# Patient Record
Sex: Female | Born: 1965 | Race: White | Hispanic: No | Marital: Single | Smoking: Current every day smoker
Health system: Southern US, Community
[De-identification: ages and names within clinical notes are randomized; demographics above are authoritative.]

## PROBLEM LIST (undated history)

## (undated) DIAGNOSIS — I10 Essential (primary) hypertension: Secondary | ICD-10-CM

## (undated) DIAGNOSIS — J449 Chronic obstructive pulmonary disease, unspecified: Secondary | ICD-10-CM

## (undated) DIAGNOSIS — M797 Fibromyalgia: Secondary | ICD-10-CM

## (undated) HISTORY — PX: APPENDECTOMY: SHX54

---

## 2006-01-15 ENCOUNTER — Ambulatory Visit: Payer: Self-pay | Admitting: Gynecology

## 2007-11-18 ENCOUNTER — Emergency Department: Payer: Self-pay | Admitting: Emergency Medicine

## 2008-10-22 ENCOUNTER — Emergency Department: Payer: Self-pay | Admitting: Emergency Medicine

## 2010-02-17 ENCOUNTER — Emergency Department: Payer: Self-pay | Admitting: Emergency Medicine

## 2010-02-20 ENCOUNTER — Emergency Department: Payer: Self-pay | Admitting: Emergency Medicine

## 2010-09-07 ENCOUNTER — Emergency Department: Payer: Self-pay | Admitting: Unknown Physician Specialty

## 2010-10-21 NOTE — Group Therapy Note (Signed)
NAME:  Gabrielle Ray, Gabrielle Ray NO.:  [id number]   MEDICAL RECORD NO.:  [id number]          PATIENT TYPE:  POB   LOCATION:  [facility]                   FACILITY:  [facility]   PHYSICIAN:  Ginger Carne, MD DATE OF BIRTH:  [birth year]   DATE OF SERVICE:  01/15/2006                                    CLINIC NOTE   [place]:  This patient is a 45 year old Caucasian female who presents with a one year  history of breast discomfort, primarily on the right and occasionally on the  left.  In the past two to three months it has been constant.  In addition,  the patient notices a clear milky to dark black discharge primarily on  squeezing the nipple, but occasionally spontaneously as well.  She does not  palpate any masses on either breast.  She takes no medications other than a  multivitamin.  She denies headaches or blurry vision.   PHYSICAL EXAMINATION:  Right and left breast demonstrate no evidence of  dominant masses.  Discharge is present on the right breast on voluntary  squeezing which is milky in appearance.  No thickenings or pinpoint  tenderness noted.  Axilla, supraclavicular nodes on either side also within  normal limits.   IMPRESSION:  Galactorrhea primarily of the right breast and right breast  discomfort.   PLAN:  Patient will have a prolactin and TSH level ordered in addition to a  diagnostic mammogram.  Follow up from there regarding management.           ______________________________  Ginger Carne, MD     SHB/MEDQ  D:  01/15/2006  T:  01/16/2006  Job:  [id number]

## 2010-11-15 ENCOUNTER — Emergency Department: Payer: Self-pay | Admitting: Emergency Medicine

## 2013-08-20 ENCOUNTER — Emergency Department: Payer: Self-pay | Admitting: Emergency Medicine

## 2014-08-11 ENCOUNTER — Emergency Department: Payer: Self-pay | Admitting: Emergency Medicine

## 2015-09-10 ENCOUNTER — Emergency Department: Payer: Worker's Compensation

## 2015-09-10 ENCOUNTER — Emergency Department
Admission: EM | Admit: 2015-09-10 | Discharge: 2015-09-10 | Disposition: A | Discharge status: Home / Self Care | Payer: Worker's Compensation | Attending: Emergency Medicine | Admitting: Emergency Medicine

## 2015-09-10 ENCOUNTER — Encounter: Payer: Self-pay | Admitting: Emergency Medicine

## 2015-09-10 DIAGNOSIS — F17210 Nicotine dependence, cigarettes, uncomplicated: Secondary | ICD-10-CM | POA: Diagnosis not present

## 2015-09-10 DIAGNOSIS — Y9389 Activity, other specified: Secondary | ICD-10-CM | POA: Insufficient documentation

## 2015-09-10 DIAGNOSIS — Y929 Unspecified place or not applicable: Secondary | ICD-10-CM | POA: Insufficient documentation

## 2015-09-10 DIAGNOSIS — Y990 Civilian activity done for income or pay: Secondary | ICD-10-CM | POA: Insufficient documentation

## 2015-09-10 DIAGNOSIS — S3992XA Unspecified injury of lower back, initial encounter: Secondary | ICD-10-CM

## 2015-09-10 DIAGNOSIS — W19XXXA Unspecified fall, initial encounter: Secondary | ICD-10-CM

## 2015-09-10 DIAGNOSIS — I10 Essential (primary) hypertension: Secondary | ICD-10-CM | POA: Diagnosis not present

## 2015-09-10 DIAGNOSIS — W1831XA Fall on same level due to stepping on an object, initial encounter: Secondary | ICD-10-CM | POA: Insufficient documentation

## 2015-09-10 DIAGNOSIS — S39012A Strain of muscle, fascia and tendon of lower back, initial encounter: Secondary | ICD-10-CM

## 2015-09-10 HISTORY — DX: Essential (primary) hypertension: I10

## 2015-09-10 HISTORY — DX: Fibromyalgia: M79.7

## 2015-09-10 MED ORDER — METHOCARBAMOL 500 MG PO TABS: 500.0000 mg | ORAL_TABLET | Freq: Four times a day (QID) | ORAL | Status: DC

## 2015-09-10 MED ORDER — MELOXICAM 15 MG PO TABS: 15.0000 mg | ORAL_TABLET | Freq: Every day | ORAL | Status: DC

## 2015-09-10 NOTE — ED Notes (Signed)
Urine drug screen performed. 

## 2015-09-10 NOTE — ED Notes (Signed)
Pt in via triage w/ complaints of lower back pain x 1 month.  Pt reports incident at work approximately a month ago where she stepped down quickly to keep from falling and back has been hurting since then.  Pt reports pain has worsened over the last two days.  Pt ambulatory but with difficulty; pt reports pain/difficulty walking, sitting, standing.  Pt A/Ox4.  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

## 2015-09-10 NOTE — Discharge Instructions (Signed)
Back Exercises °The following exercises strengthen the muscles that help to support the back. They also help to keep the lower back flexible. Doing these exercises can help to prevent back pain or lessen existing pain. °If you have back pain or discomfort, try doing these exercises 2-3 times each day or as told by your health care provider. When the pain goes away, do them once each day, but increase the number of times that you repeat the steps for each exercise (do more repetitions). If you do not have back pain or discomfort, do these exercises once each day or as told by your health care provider. °EXERCISES °Single Knee to Chest °Repeat these steps 3-5 times for each leg: °· Lie on your back on a firm bed or the floor with your legs extended. °· Bring one knee to your chest. Your other leg should stay extended and in contact with the floor. °· Hold your knee in place by grabbing your knee or thigh. °· Pull on your knee until you feel a gentle stretch in your lower back. °· Hold the stretch for 10-30 seconds. °· Slowly release and straighten your leg. °Pelvic Tilt °Repeat these steps 5-10 times: °· Lie on your back on a firm bed or the floor with your legs extended. °· Bend your knees so they are pointing toward the ceiling and your feet are flat on the floor. °· Tighten your lower abdominal muscles to press your lower back against the floor. This motion will tilt your pelvis so your tailbone points up toward the ceiling instead of pointing to your feet or the floor. °· With gentle tension and even breathing, hold this position for 5-10 seconds. °Cat-Cow °Repeat these steps until your lower back becomes more flexible: °· Get into a hands-and-knees position on a firm surface. Keep your hands under your shoulders, and keep your knees under your hips. You may place padding under your knees for comfort. °· Let your head hang down, and point your tailbone toward the floor so your lower back becomes rounded like the  back of a cat. °· Hold this position for 5 seconds. °· Slowly lift your head and point your tailbone up toward the ceiling so your back forms a sagging arch like the back of a cow. °· Hold this position for 5 seconds. °Press-Ups °Repeat these steps 5-10 times: °· Lie on your abdomen (face-down) on the floor. °· Place your palms near your head, about shoulder-width apart. °· While you keep your back as relaxed as possible and keep your hips on the floor, slowly straighten your arms to raise the top half of your body and lift your shoulders. Do not use your back muscles to raise your upper torso. You may adjust the placement of your hands to make yourself more comfortable. °· Hold this position for 5 seconds while you keep your back relaxed. °· Slowly return to lying flat on the floor. °Bridges °Repeat these steps 10 times: °· Lie on your back on a firm surface. °· Bend your knees so they are pointing toward the ceiling and your feet are flat on the floor. °· Tighten your buttocks muscles and lift your buttocks off of the floor until your waist is at almost the same height as your knees. You should feel the muscles working in your buttocks and the back of your thighs. If you do not feel these muscles, slide your feet 1-2 inches farther away from your buttocks. °· Hold this position for 3-5   seconds. °· Slowly lower your hips to the starting position, and allow your buttocks muscles to relax completely. °If this exercise is too easy, try doing it with your arms crossed over your chest. °Abdominal Crunches °Repeat these steps 5-10 times: °· Lie on your back on a firm bed or the floor with your legs extended. °· Bend your knees so they are pointing toward the ceiling and your feet are flat on the floor. °· Cross your arms over your chest. °· Tip your chin slightly toward your chest without bending your neck. °· Tighten your abdominal muscles and slowly raise your trunk (torso) high enough to lift your shoulder blades a  tiny bit off of the floor. Avoid raising your torso higher than that, because it can put too much stress on your low back and it does not help to strengthen your abdominal muscles. °· Slowly return to your starting position. °Back Lifts °Repeat these steps 5-10 times: °· Lie on your abdomen (face-down) with your arms at your sides, and rest your forehead on the floor. °· Tighten the muscles in your legs and your buttocks. °· Slowly lift your chest off of the floor while you keep your hips pressed to the floor. Keep the back of your head in line with the curve in your back. Your eyes should be looking at the floor. °· Hold this position for 3-5 seconds. °· Slowly return to your starting position. °SEEK MEDICAL CARE IF: °· Your back pain or discomfort gets much worse when you do an exercise. °· Your back pain or discomfort does not lessen within 2 hours after you exercise. °If you have any of these problems, stop doing these exercises right away. Do not do them again unless your health care provider says that you can. °SEEK IMMEDIATE MEDICAL CARE IF: °· You develop sudden, severe back pain. If this happens, stop doing the exercises right away. Do not do them again unless your health care provider says that you can. °  °This information is not intended to replace advice given to you by your health care provider. Make sure you discuss any questions you have with your health care provider. °  °Document Released: 06/29/2004 Document Revised: 02/10/2015 Document Reviewed: 07/16/2014 °Elsevier Interactive Patient Education ©2016 [employer] °Sciatica °Sciatica is pain, weakness, numbness, or tingling along the path of the sciatic nerve. The nerve starts in the lower back and runs down the back of each leg. The nerve controls the muscles in the lower leg and in the back of the knee, while also providing sensation to the back of the thigh, lower leg, and the sole of your foot. Sciatica is a symptom of another medical  condition. For instance, nerve damage or certain conditions, such as a herniated disk or bone spur on the spine, pinch or put pressure on the sciatic nerve. This causes the pain, weakness, or other sensations normally associated with sciatica. Generally, sciatica only affects one side of the body. °CAUSES  °· Herniated or slipped disc. °· Degenerative disk disease. °· A pain disorder involving the narrow muscle in the buttocks (piriformis syndrome). °· Pelvic injury or fracture. °· Pregnancy. °· Tumor (rare). °SYMPTOMS  °Symptoms can vary from mild to very severe. The symptoms usually travel from the low back to the buttocks and down the back of the leg. Symptoms can include: °· Mild tingling or dull aches in the lower back, leg, or hip. °· Numbness in the back of the calf or sole of the   foot. °· Burning sensations in the lower back, leg, or hip. °· Sharp pains in the lower back, leg, or hip. °· Leg weakness. °· Severe back pain inhibiting movement. °These symptoms may get worse with coughing, sneezing, laughing, or prolonged sitting or standing. Also, being overweight may worsen symptoms. °DIAGNOSIS  °Your caregiver will perform a physical exam to look for common symptoms of sciatica. He or she may ask you to do certain movements or activities that would trigger sciatic nerve pain. Other tests may be performed to find the cause of the sciatica. These may include: °· Blood tests. °· X-rays. °· Imaging tests, such as an MRI or CT scan. °TREATMENT  °Treatment is directed at the cause of the sciatic pain. Sometimes, treatment is not necessary and the pain and discomfort goes away on its own. If treatment is needed, your caregiver may suggest: °· Over-the-counter medicines to relieve pain. °· Prescription medicines, such as anti-inflammatory medicine, muscle relaxants, or narcotics. °· Applying heat or ice to the painful area. °· Steroid injections to lessen pain, irritation, and inflammation around the  nerve. °· Reducing activity during periods of pain. °· Exercising and stretching to strengthen your abdomen and improve flexibility of your spine. Your caregiver may suggest losing weight if the extra weight makes the back pain worse. °· Physical therapy. °· Surgery to eliminate what is pressing or pinching the nerve, such as a bone spur or part of a herniated disk. °HOME CARE INSTRUCTIONS  °· Only take over-the-counter or prescription medicines for pain or discomfort as directed by your caregiver. °· Apply ice to the affected area for 20 minutes, 3-4 times a day for the first 48-72 hours. Then try heat in the same way. °· Exercise, stretch, or perform your usual activities if these do not aggravate your pain. °· Attend physical therapy sessions as directed by your caregiver. °· Keep all follow-up appointments as directed by your caregiver. °· Do not wear high heels or shoes that do not provide proper support. °· Check your mattress to see if it is too soft. A firm mattress may lessen your pain and discomfort. °SEEK IMMEDIATE MEDICAL CARE IF:  °· You lose control of your bowel or bladder (incontinence). °· You have increasing weakness in the lower back, pelvis, buttocks, or legs. °· You have redness or swelling of your back. °· You have a burning sensation when you urinate. °· You have pain that gets worse when you lie down or awakens you at night. °· Your pain is worse than you have experienced in the past. °· Your pain is lasting longer than 4 weeks. °· You are suddenly losing weight without reason. °MAKE SURE YOU: °· Understand these instructions. °· Will watch your condition. °· Will get help right away if you are not doing well or get worse. °  °This information is not intended to replace advice given to you by your health care provider. Make sure you discuss any questions you have with your health care provider. °  °Document Released: 05/16/2001 Document Revised: 02/10/2015 Document Reviewed:  10/01/2011 °Elsevier Interactive Patient Education ©2016 [employer] Inc. ° °

## 2015-09-10 NOTE — ED Notes (Signed)
Pt alert and oriented X4, active, cooperative, pt in NAD. RR even and unlabored, color WNL.  Pt informed to return if any life threatening symptoms occur.   

## 2015-09-10 NOTE — ED Notes (Signed)
Patient reports back pain a month ago but increased last night and today.  Patient slipped on a machine at work about a month ago and fell on to back and right leg.  Bilateral lower back pain, worse on the left. Patient states she has difficulty sitting, laying or standing.  Pain is worse when sitting down.  Patient had a neurology appointment.  Pt has a hx of fibromyalgia and is currently on gabapentin and zanaflex.

## 2015-09-10 NOTE — ED Provider Notes (Signed)
[facility] Emergency Department Provider Note  ____________________________________________  Time seen: Approximately 8:20 PM  I have reviewed the triage vital signs and the nursing notes.   HISTORY  Chief Complaint Back Pain    HPI Gabrielle Ray is a 50 y.o. female who presents emergency department complaining of lower back pain 1 month. Patient states that she was at work approximately a month ago when she stepped back on a piece of equipment and landed on her feet. She states that she jarred her back at that time and has had some pains since this occurrence. She states the pain is increasing is a sharp sensation. She states that occasionally it radiates down bilateral legs. She denies any bowel or bladder dysfunction, saddle anesthesia, paresthesias. Patient is not taking any medications for this complaint at this time.   Past Medical History  Diagnosis Date  . Hypertension   . Fibromyalgia     There are no active problems to display for this patient.   History reviewed. No pertinent past surgical history.  Current Outpatient Rx  Name  Route  Sig  Dispense  Refill  . meloxicam (MOBIC) 15 MG tablet   Oral   Take 1 tablet (15 mg total) by mouth daily.   30 tablet   0   . methocarbamol (ROBAXIN) 500 MG tablet   Oral   Take 1 tablet (500 mg total) by mouth 4 (four) times daily.   16 tablet   0     Allergies Review of patient's allergies indicates no known allergies.  History reviewed. No pertinent family history.  Social History Social History  Substance Use Topics  . Smoking status: Current Every Day Smoker -- 1.00 packs/day    Types: Cigarettes  . Smokeless tobacco: None  . Alcohol Use: No     Review of Systems  Constitutional: No fever/chills Cardiovascular: no chest pain. Respiratory: no cough. No SOB. Genitourinary: Negative for dysuria. No hematuria Musculoskeletal: Positive for lower back pain. Skin: Negative for  rash. Neurological: Negative for headaches, focal weakness or numbness. 10-point ROS otherwise negative.  ____________________________________________   PHYSICAL EXAM:  VITAL SIGNS: ED Triage Vitals  Enc Vitals Group     BP 09/10/15 1847 141/81 mmHg     Pulse Rate 09/10/15 1847 80     Resp 09/10/15 1847 18     Temp 09/10/15 1847 97.5 F (36.4 C)     Temp Source 09/10/15 1847 Oral     SpO2 09/10/15 1847 98 %     Weight 09/10/15 1851 220 lb (99.791 kg)     Height 09/10/15 1851 5\' 5"  (1.651 m)     Head Cir --      Peak Flow --      Pain Score 09/10/15 1851 8     Pain Loc --      Pain Edu? --      Excl. in GC? --      Constitutional: Alert and oriented. Well appearing and in no acute distress. Eyes: Conjunctivae are normal. PERRL. EOMI. Head: Atraumatic. Cardiovascular: Normal rate, regular rhythm. Normal S1 and S2.  Good peripheral circulation. Respiratory: Normal respiratory effort without tachypnea or retractions. Lungs CTAB. Gastrointestinal: Soft and nontender. No distention. No CVA tenderness. Musculoskeletal: No visible deformity displayed upon inspection. Patient is tender to palpation both midline as well as bilateral paraspinal muscle groups. No palpable abnormality. Patient is nontender to palpation over the sciatic notches. Negative straight leg raise bilaterally. Sensation intact equally lower extremities.  Dorsalis pedis pulse is intact or shortness. Neurologic:  Normal speech and language. No gross focal neurologic deficits are appreciated.  Skin:  Skin is warm, dry and intact. No rash noted. Psychiatric: Mood and affect are normal. Speech and behavior are normal. Patient exhibits appropriate insight and judgement.   ____________________________________________   LABS (all labs ordered are listed, but only abnormal results are displayed)  Labs Reviewed - No data to  display ____________________________________________  EKG   ____________________________________________  RADIOLOGY Festus Barren Vinie Charity, personally viewed and evaluated these images (plain radiographs) as part of my medical decision making, as well as reviewing the written report by the radiologist.  Dg Lumbar Spine Complete  09/10/2015  CLINICAL DATA:  Low back pain for 1 month following fall at work, initial encounter EXAM: LUMBAR SPINE - COMPLETE 4+ VIEW COMPARISON:  None. FINDINGS: Six non rib-bearing lumbar vertebra are noted. Vertebral body height is well maintained. No pars defects are seen. No spondylolisthesis is noted. No gross soft tissue abnormality is seen. IMPRESSION: No acute abnormality noted. Electronically Signed   By: Alcide Clever M.D.   On: 09/10/2015 20:48    ____________________________________________    PROCEDURES  Procedure(s) performed:       Medications - No data to display   ____________________________________________   INITIAL IMPRESSION / ASSESSMENT AND PLAN / ED COURSE  Pertinent labs & imaging results that were available during my care of the patient were reviewed by me and considered in my medical decision making (see chart for details).  Patient's diagnosis is consistent with fall resulting in lumbosacral strain. Exam is reassuring. X-ray reveals no acute osseous abnormality. Patient will be discharged home with prescriptions for anti-inflammatories and muscle relaxers. Patient is to follow up with primary care or orthopedics if symptoms persist past this treatment course. Patient is given ED precautions to return to the ED for any worsening or new symptoms.     ____________________________________________  FINAL CLINICAL IMPRESSION(S) / ED DIAGNOSES  Final diagnoses:  Lumbosacral strain, initial encounter  Fall, initial encounter  Back injury, initial encounter      NEW MEDICATIONS STARTED DURING THIS VISIT:  New  Prescriptions   MELOXICAM (MOBIC) 15 MG TABLET    Take 1 tablet (15 mg total) by mouth daily.   METHOCARBAMOL (ROBAXIN) 500 MG TABLET    Take 1 tablet (500 mg total) by mouth 4 (four) times daily.        This chart was dictated using voice recognition software/Dragon. Despite best efforts to proofread, errors can occur which can change the meaning. Any change was purely unintentional.    Racheal Patches, PA-C 09/10/15 2115  Minna Antis, MD 09/10/15 2337

## 2015-12-13 ENCOUNTER — Other Ambulatory Visit: Payer: Self-pay | Admitting: Orthopedic Surgery

## 2015-12-13 DIAGNOSIS — G8929 Other chronic pain: Secondary | ICD-10-CM

## 2015-12-13 DIAGNOSIS — M545 Low back pain: Principal | ICD-10-CM

## 2015-12-20 ENCOUNTER — Ambulatory Visit
Admission: RE | Admit: 2015-12-20 | Discharge: 2015-12-20 | Disposition: A | Discharge status: Home / Self Care | Payer: Worker's Compensation | Source: Ambulatory Visit | Attending: Orthopedic Surgery | Admitting: Orthopedic Surgery

## 2015-12-20 DIAGNOSIS — G8929 Other chronic pain: Secondary | ICD-10-CM

## 2015-12-20 DIAGNOSIS — M545 Low back pain, unspecified: Secondary | ICD-10-CM

## 2017-10-11 IMAGING — CT CT BIOPSY
2 of 3 series · 13 of 32 positions shown, 19 images · non-contrast
Comparison: none

CLINICAL DATA: Bilateral all low back and sacroiliac region pain,
radiating to the left buttock and to the right thigh. Clinical
request today for left sacroiliac anesthetic injection for
diagnostic purposes.

[Series 3: needle -guided injection · axial · 0.96mm/px · z∈[+778,+808]mm · 9 of 14 slices shown, 15 images (1 of 2)]
[im 2/14  soft-tissue]
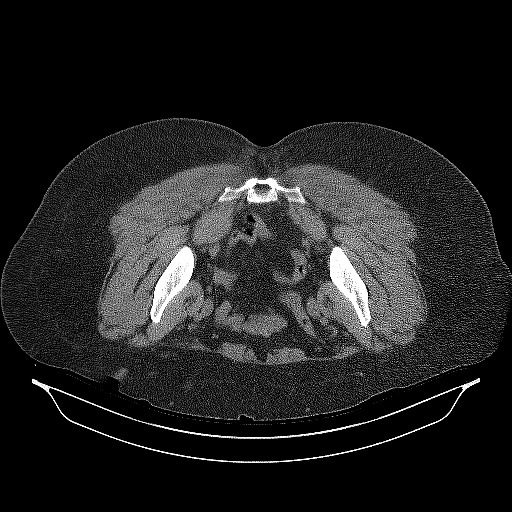
[im 2/14  bone]
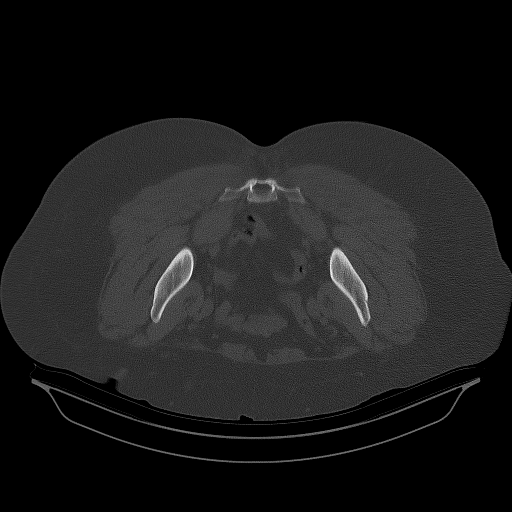
[im 3/14  soft-tissue]
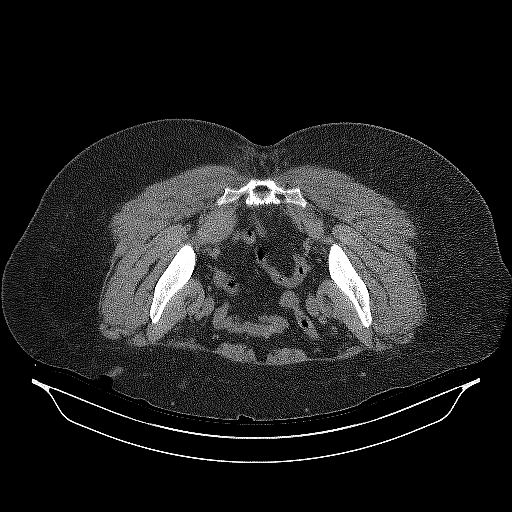
[im 4/14  soft-tissue]
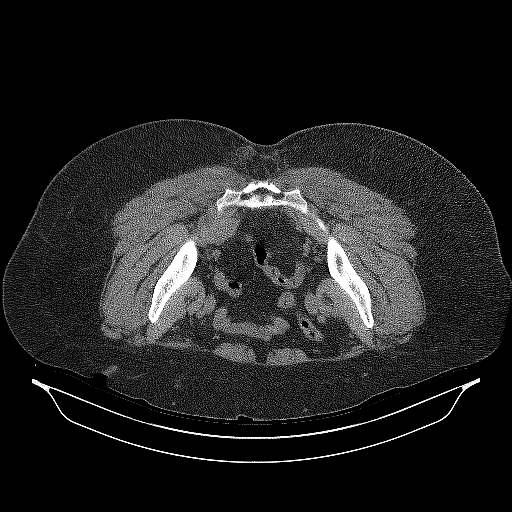
[im 6/14  soft-tissue]
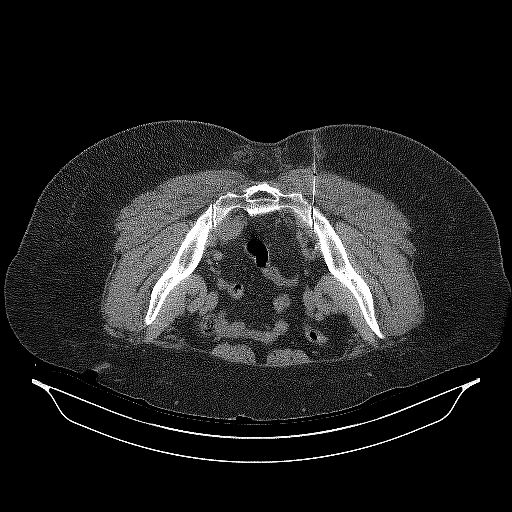
[im 7/14  soft-tissue]
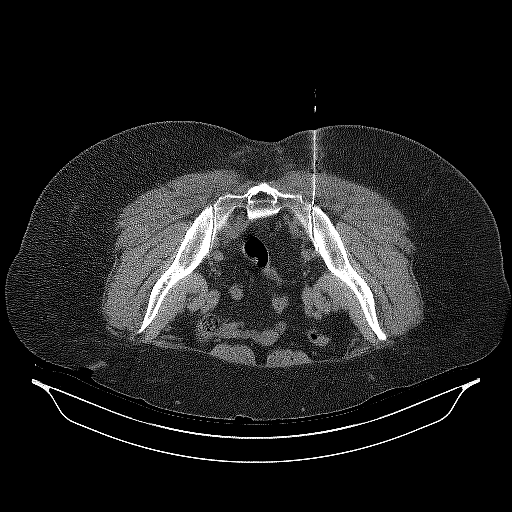
[im 8/14  soft-tissue]
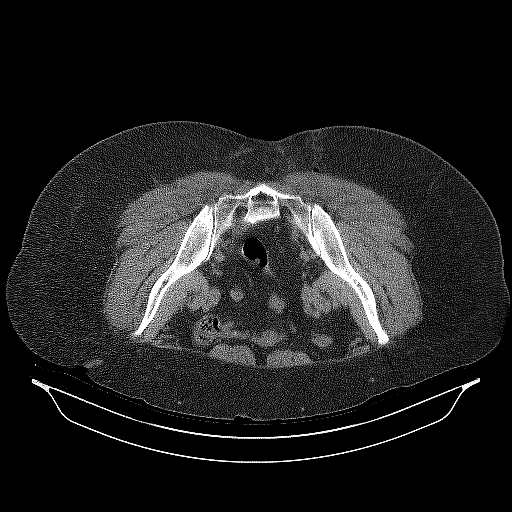
[im 8/14  lung]
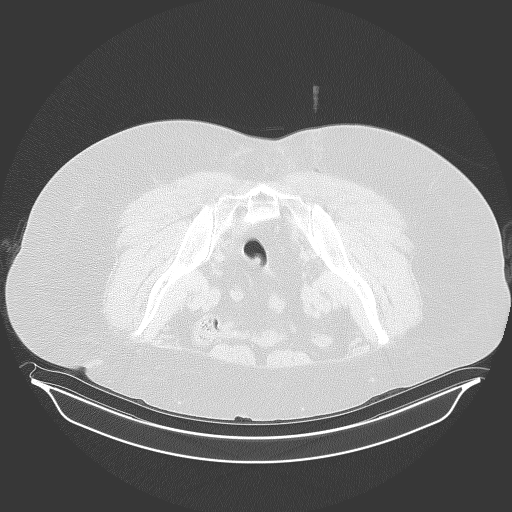
[im 10/14  soft-tissue]
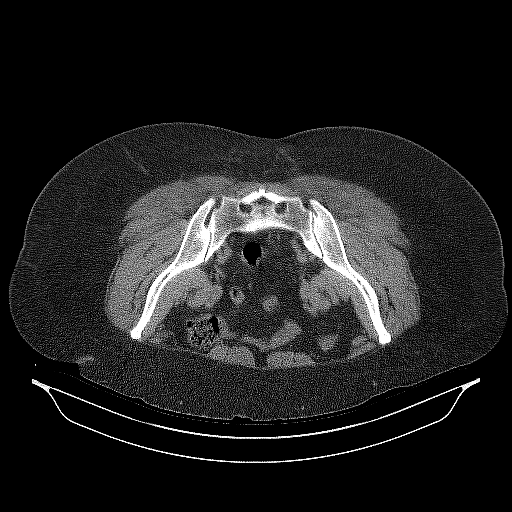
[im 10/14  lung]
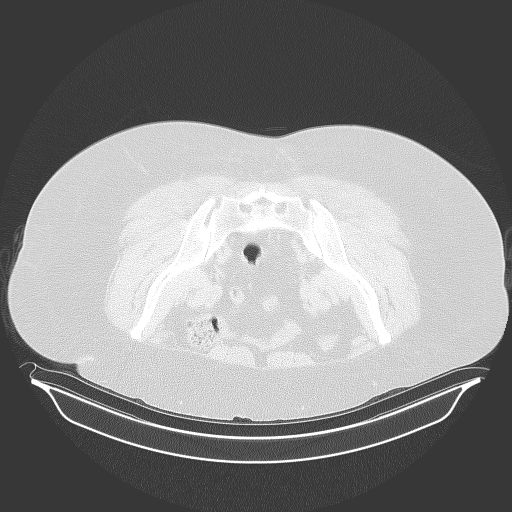
[im 11/14  soft-tissue]
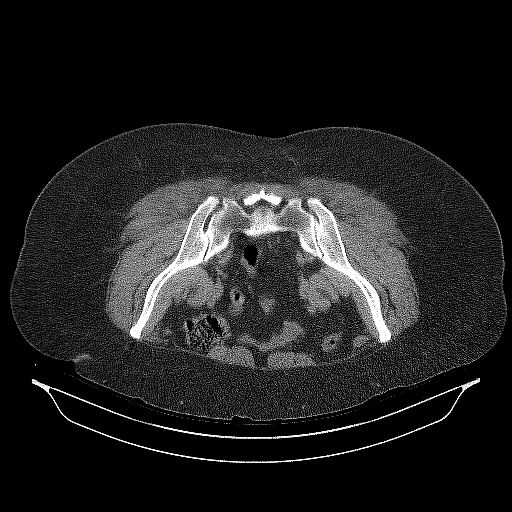
[im 11/14  lung]
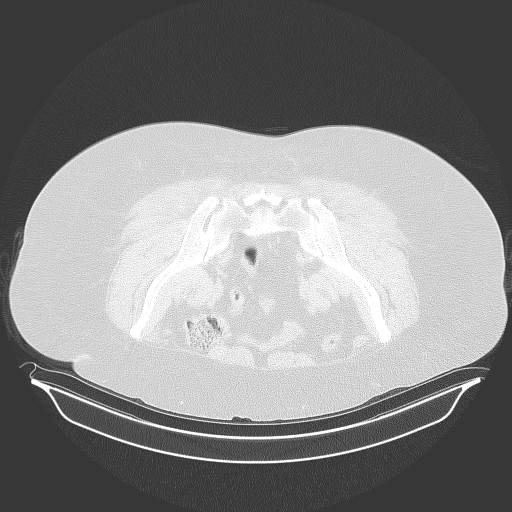
[im 12/14  soft-tissue]
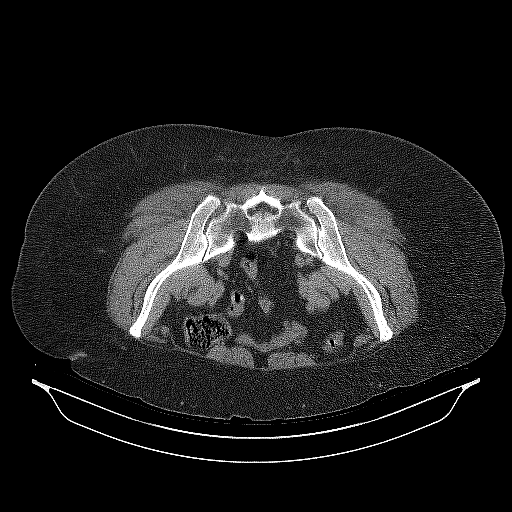
[im 12/14  lung]
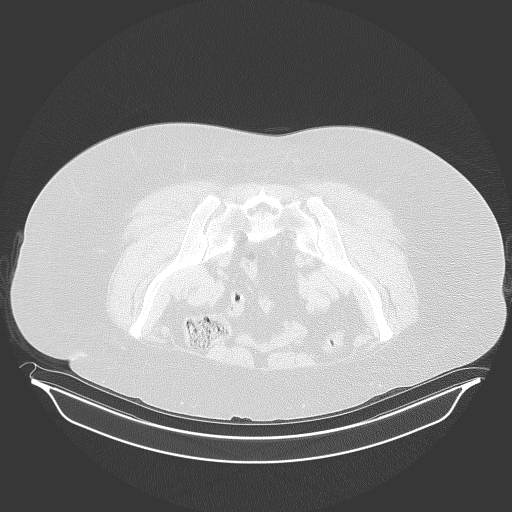
[im 12/14  bone]
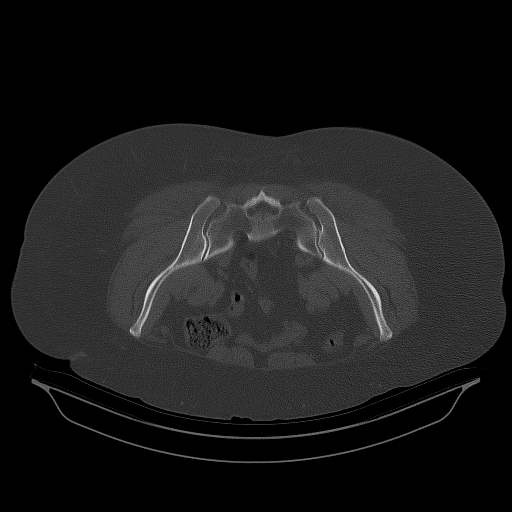

[Series 4: needle -guided injection · axial · 0.96mm/px · z∈[+778,+790]mm · 4 of 14 slices shown (2 of 2)]
[im 2/14  soft-tissue]
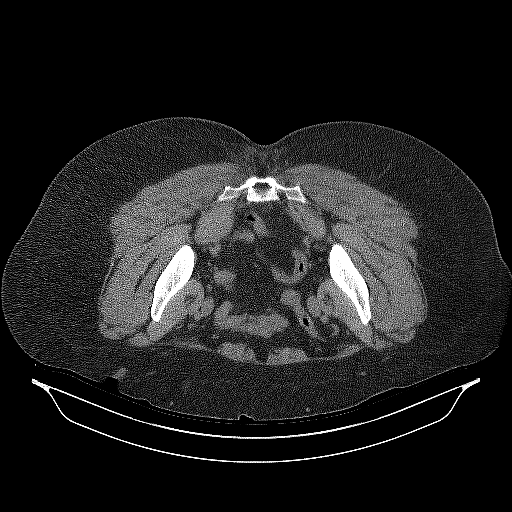
[im 3/14  soft-tissue]
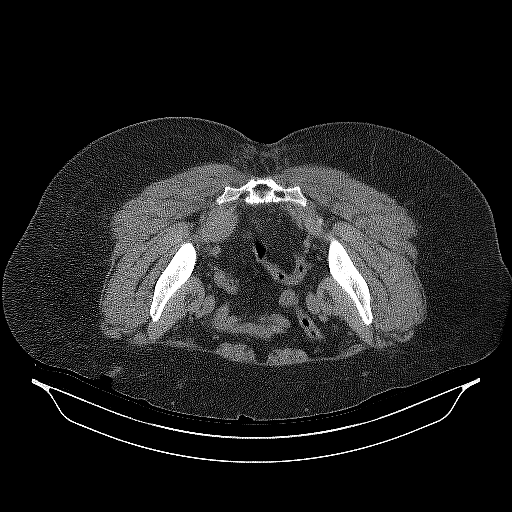
[im 4/14  soft-tissue]
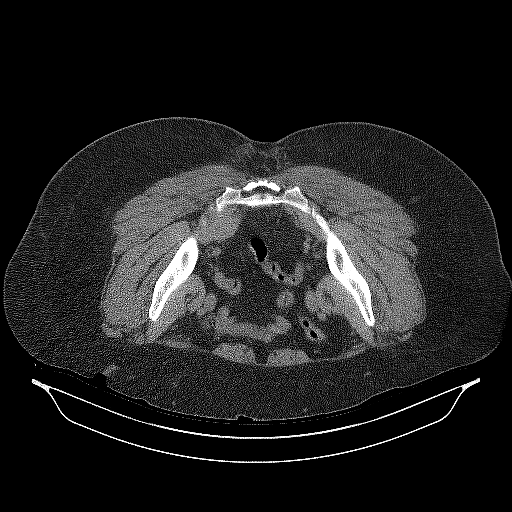
[im 6/14  soft-tissue]
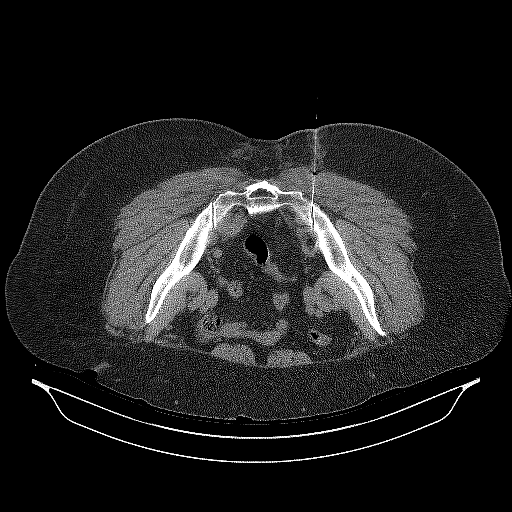

[13 of 32 positions shown; findings below may reference images not displayed]

EXAM:
Left CT GUIDED SI JOINT INJECTION



After local anesthesia with 1% lidocaine without epinephrine and
subsequent deep anesthesia, a 22 gauge spinal needle was advanced
into the left SI joint under intermittent CT guidance.

Once the needle was in satisfactory position, representative image
was captured with the needle demonstrated in the sacroiliac joint.
Subsequently, 2 cc of 0.5% bupivacaine was injected into the left SI
joint. The patient did feel discomfort during the injection which
had some concordant components. Needles removed and a sterile
dressing applied.

No complications were observed.
IMPRESSION: Successful CT-guided left SI joint injection. Some concordant pain
during the injection. The patient was instructed to keep a pain log
for the next 6 hours for discussion in follow-up.

## 2018-12-19 ENCOUNTER — Telehealth: Payer: Managed Care, Other (non HMO) | Admitting: Family

## 2018-12-19 DIAGNOSIS — H60332 Swimmer's ear, left ear: Secondary | ICD-10-CM

## 2018-12-19 MED ORDER — CIPRO HC 0.2-1 % OT SUSP: 3.0000 [drp] | Freq: Two times a day (BID) | OTIC | 0 refills | Status: DC

## 2018-12-19 MED ORDER — AMOXICILLIN-POT CLAVULANATE 500-125 MG PO TABS: 1.0000 | ORAL_TABLET | Freq: Two times a day (BID) | ORAL | 0 refills | Status: DC

## 2018-12-19 NOTE — Progress Notes (Signed)
Greater than 5 minutes, yet less than 10 minutes of time have been spent researching, coordinating, and implementing care for this patient today.  Thank you for the details you included in the comment boxes. Those details are very helpful in determining the best course of treatment for you and help [place] to provide the best care.  E Visit for Swimmer's Ear  We are sorry that you are not feeling well. Here is how we plan to help!  Based on what you have shared with me it looks like you have swimmers ear. Swimmer's ear is a redness or swelling, irritation, or infection of your outer ear canal.  These symptoms usually occur within a few days of swimming.  Your ear canal is a tube that goes from the opening of the ear to the eardrum.  When water stays in your ear canal, germs can grow.  This is a painful condition that often happens to children and swimmers of all ages.  It is not contagious and oral antibiotics are not required to treat uncomplicated swimmer's ear.  The usual symptoms include: Itching inside the ear, Redness or a sense of swelling in the ear, Pain when the ear is tugged on when pressure is placed on the ear, Pus draining from the infected ear. and I have prescribed: Ciprofloxin 0.2% and hydrocortisone 1% otic suspension 3 drops in affected ears twice daily for 7 days  Based on what you have told me you may have a bacterial infection. In addition to the ear drops I have prescribed an oral antibiotic: and Augmentin 625mg  one tablet by mouth twice a day for 10 days  In certain cases swimmer's ear may progress to a more serious bacterial infection of the middle or inner ear.  If you have a fever 102 and up and significantly worsening symptoms, this could indicate a more serious infection moving to the middle/inner and needs face to face evaluation in an office by a provider.  Your symptoms should improve over the next 3 days and should resolve in about 7 days.  HOME CARE:   Wash your hands  frequently.  Do not place the tip of the bottle on your ear or touch it with your fingers.  You can take Acetominophen 650 mg every 4-6 hours as needed for pain.  If pain is severe or moderate, you can apply a heating pad (set on low) or hot water bottle (wrapped in a towel) to outer ear for 20 minutes.  This will also increase drainage.  Avoid ear plugs  Do not use Q-tips  After showers, help the water run out by tilting your head to one side.  GET HELP RIGHT AWAY IF:   Fever is over 102.2 degrees.  You develop progressive ear pain or hearing loss.  Ear symptoms persist longer than 3 days after treatment.  MAKE SURE YOU:   Understand these instructions.  Will watch your condition.  Will get help right away if you are not doing well or get worse.  TO PREVENT SWIMMER'S EAR:  Use a bathing cap or custom fitted swim molds to keep your ears dry.  Towel off after swimming to dry your ears.  Tilt your head or pull your earlobes to allow the water to escape your ear canal.  If there is still water in your ears, consider using a hairdryer on the lowest setting.  Thank you for choosing an e-visit. Your e-visit answers were reviewed by a board certified advanced clinical practitioner to  complete your personal care plan. Depending upon the condition, your plan could have included both over the counter or prescription medications. Please review your pharmacy choice. Be sure that the pharmacy you have chosen is open so that you can pick up your prescription now.  If there is a problem you may message your provider in MyChart to have the prescription routed to another pharmacy. Your safety is important to us. If you have drug allergies check your prescription carefully.  For the next 24 hours, you can use MyChart to ask questions about today's visit, request a non-urgent call back, or ask for a work or school excuse from your e-visit provider. You will get an email in the next two days  asking about your experience. I hope that your e-visit has been valuable and will speed your recovery.

## 2018-12-19 NOTE — Addendum Note (Signed)
Addended by: Benjamine Mola on: 12/19/2018 04:05 PM   Modules accepted: Orders

## 2020-04-16 ENCOUNTER — Encounter (INDEPENDENT_AMBULATORY_CARE_PROVIDER_SITE_OTHER): Payer: Self-pay

## 2020-12-27 ENCOUNTER — Emergency Department: Payer: Medicare Other

## 2020-12-27 ENCOUNTER — Emergency Department
Admission: EM | Admit: 2020-12-27 | Discharge: 2020-12-27 | Disposition: A | Discharge status: Home / Self Care | Payer: Medicare Other | Attending: Emergency Medicine | Admitting: Emergency Medicine

## 2020-12-27 ENCOUNTER — Other Ambulatory Visit: Payer: Self-pay

## 2020-12-27 DIAGNOSIS — Z23 Encounter for immunization: Secondary | ICD-10-CM | POA: Diagnosis not present

## 2020-12-27 DIAGNOSIS — F17210 Nicotine dependence, cigarettes, uncomplicated: Secondary | ICD-10-CM | POA: Insufficient documentation

## 2020-12-27 DIAGNOSIS — S8992XA Unspecified injury of left lower leg, initial encounter: Secondary | ICD-10-CM | POA: Diagnosis present

## 2020-12-27 DIAGNOSIS — W19XXXA Unspecified fall, initial encounter: Secondary | ICD-10-CM | POA: Diagnosis not present

## 2020-12-27 DIAGNOSIS — I10 Essential (primary) hypertension: Secondary | ICD-10-CM | POA: Insufficient documentation

## 2020-12-27 DIAGNOSIS — S80812A Abrasion, left lower leg, initial encounter: Secondary | ICD-10-CM | POA: Insufficient documentation

## 2020-12-27 DIAGNOSIS — T148XXA Other injury of unspecified body region, initial encounter: Secondary | ICD-10-CM

## 2020-12-27 DIAGNOSIS — M7918 Myalgia, other site: Secondary | ICD-10-CM

## 2020-12-27 MED ORDER — TETANUS-DIPHTH-ACELL PERTUSSIS 5-2.5-18.5 LF-MCG/0.5 IM SUSY
0.5000 mL | PREFILLED_SYRINGE | Freq: Once | INTRAMUSCULAR | Status: AC
  Administered 2020-12-27: 0.5 mL via INTRAMUSCULAR
  Filled 2020-12-27: qty 0.5

## 2020-12-27 MED ORDER — CEPHALEXIN 500 MG PO CAPS: 500.0000 mg | ORAL_CAPSULE | Freq: Two times a day (BID) | ORAL | 0 refills | Status: AC

## 2020-12-27 NOTE — ED Provider Notes (Signed)
[facility] Emergency Department Provider Note  ____________________________________________  Time seen: Approximately 6:17 PM  I have reviewed the triage vital signs and the nursing notes.   HISTORY  Chief Complaint Laceration   HPI Gabrielle Ray is a 55 y.o. female presents to the ER for evaluation of left calf pain. She didn't see the pool pump and stepped just close enough to scrape the outside of the calf. She subsequently fell, but denies head injury or loss of consciousness. She is unsure of her last Tdap booster.  Past Medical History:  Diagnosis Date   Fibromyalgia    Hypertension     There are no problems to display for this patient.   No past surgical history on file.  Prior to Admission medications   Medication Sig Start Date End Date Taking? Authorizing Provider  cephALEXin (KEFLEX) 500 MG capsule Take 1 capsule (500 mg total) by mouth 2 (two) times daily for 7 days. 12/27/20 01/03/21 Yes Dashel Goines B, FNP  amoxicillin-clavulanate (AUGMENTIN) 500-125 MG tablet Take 1 tablet (500 mg total) by mouth 2 (two) times a day. 12/19/18   Withrow, Everardo All, FNP  ciprofloxacin-hydrocortisone (CIPRO HC) OTIC suspension Place 3 drops into the left ear 2 (two) times daily. For 7 days 12/19/18   Beau Fanny, FNP  meloxicam (MOBIC) 15 MG tablet Take 1 tablet (15 mg total) by mouth daily. 09/10/15   Cuthriell, Delorise Royals, PA-C  methocarbamol (ROBAXIN) 500 MG tablet Take 1 tablet (500 mg total) by mouth 4 (four) times daily. 09/10/15   Cuthriell, Delorise Royals, PA-C    Allergies Patient has no known allergies.  No family history on file.  Social History Social History   Tobacco Use   Smoking status: Every Day    Packs/day: 1.00    Types: Cigarettes  Substance Use Topics   Alcohol use: No    Review of Systems  Constitutional: Negative for fever. Respiratory: Negative for cough or shortness of breath.  Musculoskeletal: Negative for myalgias Skin:  Positive for wound to the left calf. Neurological: Negative for numbness or paresthesias. ____________________________________________   PHYSICAL EXAM:  VITAL SIGNS: ED Triage Vitals  Enc Vitals Group     BP 12/27/20 1114 116/83     Pulse Rate 12/27/20 1114 82     Resp 12/27/20 1114 18     Temp 12/27/20 1114 97.7 F (36.5 C)     Temp Source 12/27/20 1114 Oral     SpO2 12/27/20 1114 96 %     Weight 12/27/20 1115 240 lb (108.9 kg)     Height 12/27/20 1115 5\' 5"  (1.651 m)     Head Circumference --      Peak Flow --      Pain Score 12/27/20 1115 5     Pain Loc --      Pain Edu? --      Excl. in GC? --      Constitutional: Well appearing. Eyes: Conjunctivae are clear without discharge or drainage. Nose: No rhinorrhea noted. Mouth/Throat: Airway is patent.  Neck: No stridor. Unrestricted range of motion observed. Cardiovascular: Capillary refill is <3 seconds.  Respiratory: Respirations are even and unlabored.. Musculoskeletal: Unrestricted range of motion observed. Ambulatory without assistance. Passive flexion and extension of the foot without calf pain. Neurologic: Awake, alert, and oriented x 4.  Skin:  Multiple abrasions over the lateral aspect of the left calf.   ____________________________________________   LABS (all labs ordered are listed, but only abnormal results  are displayed)  Labs Reviewed - No data to display ____________________________________________  EKG  Not indicated. ____________________________________________  RADIOLOGY  No bony abnormality of the left lower extremity; no radiopaque retained foreign body. ____________________________________________   PROCEDURES  Procedures: wound care as below ____________________________________________   INITIAL IMPRESSION / ASSESSMENT AND PLAN / ED COURSE  Gabrielle Ray is a 55 y.o. female presents to the ER for evaluation after injury of the left calf. Upon initial exam, there was concern  for muscle tear as the area appears to have an impression where the abrasions are and then a bulge just above. However, there are no deep lacerations that would indicate muscle laceration and the bulging area is mid lateral calf. She has full flexion and extension of the foot and ankle without calf pain. Muscle appears to move well with foot motion. Bulging area now most consistent with damage to the adipose tissue below the injury. Wounds were cleaned well with Hibiclens and saline. Wet to dry dressing applied. Home wound care discussed.  Patient advised to follow up with orthopedics if she has any issue with walking or if she develops deep muscle pain. She is to see PCP for any concern of infection.   Medications  Tdap (BOOSTRIX) injection 0.5 mL (0.5 mLs Intramuscular Given 12/27/20 1314)     Pertinent labs & imaging results that were available during my care of the patient were reviewed by me and considered in my medical decision making (see chart for details).  ____________________________________________   FINAL CLINICAL IMPRESSION(S) / ED DIAGNOSES  Final diagnoses:  Fall, initial encounter  Musculoskeletal pain  Abrasion    ED Discharge Orders          Ordered    cephALEXin (KEFLEX) 500 MG capsule  2 times daily        12/27/20 1327             Note:  This document was prepared using Dragon voice recognition software and may include unintentional dictation errors.   Chinita Pester, FNP 12/27/20 Cephus Shelling, MD 12/27/20 [phone]

## 2020-12-27 NOTE — ED Triage Notes (Signed)
Pt here with a laceration to her left leg after cutting it on a fuel pump. Pt has a large gash to left outside leg, bleeding controlled.

## 2020-12-27 NOTE — Discharge Instructions (Addendum)
Do not get the sutured area wet for 24 hours. After 24 hours, shower/bathe as usual and pat the area dry. Change the bandage 2 times per day and apply antibiotic ointment. Leave open to air when at no risk of getting the area dirty, but cover at night before bed.   

## 2021-07-21 ENCOUNTER — Emergency Department: Payer: Medicare Other

## 2021-07-21 ENCOUNTER — Emergency Department
Admission: EM | Admit: 2021-07-21 | Discharge: 2021-07-21 | Disposition: A | Discharge status: Home / Self Care | Payer: Medicare Other | Attending: Emergency Medicine | Admitting: Emergency Medicine

## 2021-07-21 ENCOUNTER — Other Ambulatory Visit: Payer: Self-pay

## 2021-07-21 DIAGNOSIS — Z5321 Procedure and treatment not carried out due to patient leaving prior to being seen by health care provider: Secondary | ICD-10-CM | POA: Insufficient documentation

## 2021-07-21 DIAGNOSIS — R079 Chest pain, unspecified: Secondary | ICD-10-CM | POA: Diagnosis not present

## 2021-07-21 LAB — CBC
HCT: 45.7 % (ref 36.0–46.0)
Hemoglobin: 15.3 g/dL — ABNORMAL HIGH (ref 12.0–15.0)
MCH: 30.7 pg (ref 26.0–34.0)
MCHC: 33.5 g/dL (ref 30.0–36.0)
MCV: 91.8 fL (ref 80.0–100.0)
Platelets: 298 10*3/uL (ref 150–400)
RBC: 4.98 MIL/uL (ref 3.87–5.11)
RDW: 13.3 % (ref 11.5–15.5)
WBC: 8.7 10*3/uL (ref 4.0–10.5)
nRBC: 0 % (ref 0.0–0.2)

## 2021-07-21 LAB — BASIC METABOLIC PANEL
Anion gap: 6 (ref 5–15)
BUN: 7 mg/dL (ref 6–20)
CO2: 28 mmol/L (ref 22–32)
Calcium: 9 mg/dL (ref 8.9–10.3)
Chloride: 103 mmol/L (ref 98–111)
Creatinine, Ser: 0.85 mg/dL (ref 0.44–1.00)
GFR, Estimated: >60 mL/min (ref >60)
Glucose, Bld: 118 mg/dL — ABNORMAL HIGH (ref 70–99)
Potassium: 3.7 mmol/L (ref 3.5–5.1)
Sodium: 137 mmol/L (ref 135–145)

## 2021-07-21 LAB — TROPONIN I (HIGH SENSITIVITY): Troponin I (High Sensitivity): 4 ng/L (ref <18)

## 2021-07-21 NOTE — ED Notes (Signed)
First RN attempted to call patient in order to room her.  Pt informed RN that she has left the facility due to feeling better.

## 2021-07-21 NOTE — ED Triage Notes (Signed)
Pt comes with c/o mid sternal CP  that started after she got up this am. Pt states radiation to her back. Pt states pain when taking deep breath. Pt states she took tramadol with no relief.

## 2023-05-13 IMAGING — CR DG CHEST 2V
2 series · 2 of 2 positions shown · non-contrast
Comparison: 09/07/2010 chest radiograph.

CLINICAL DATA: Chest pain

EXAM:
CHEST - 2 VIEW

[chest pa]
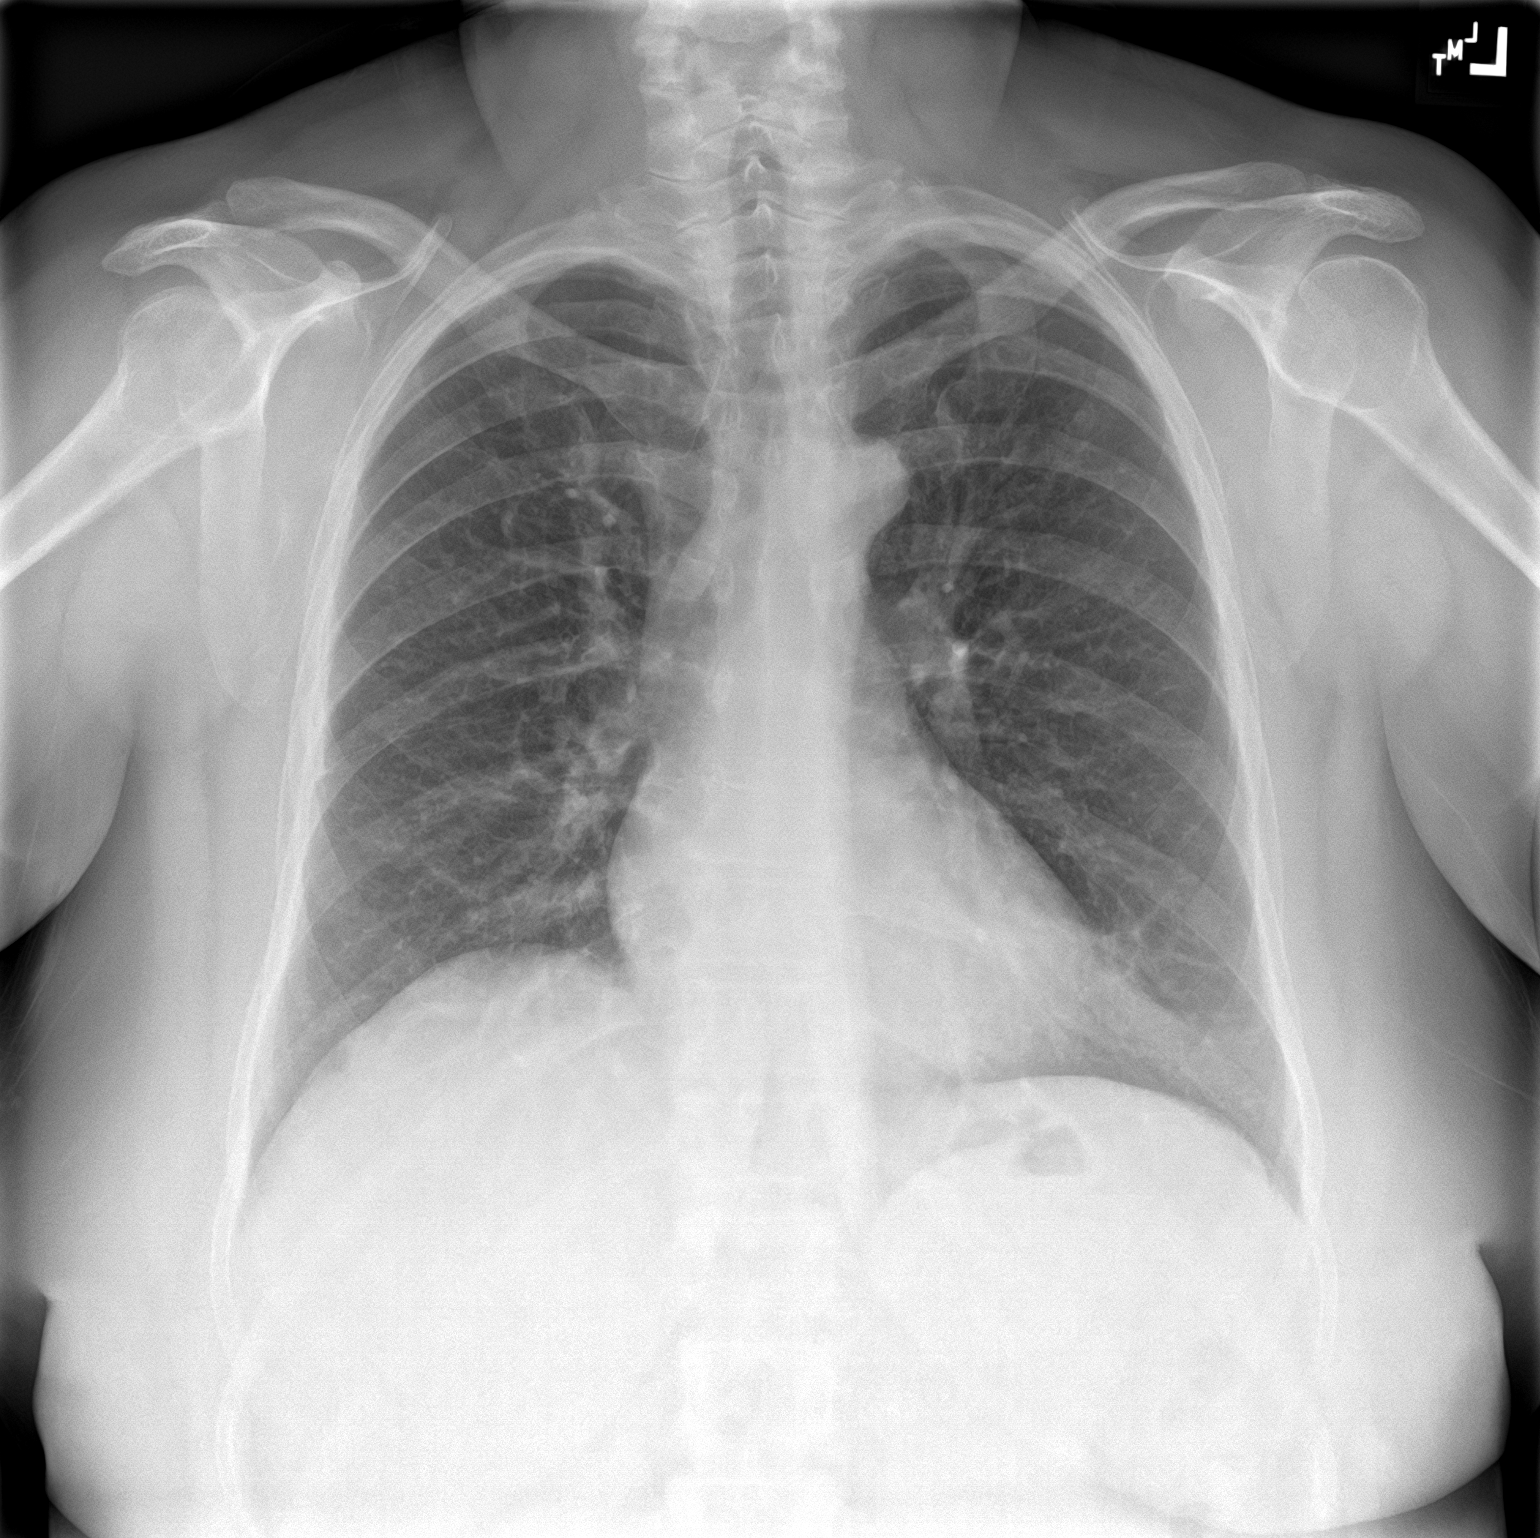

[chest lat]
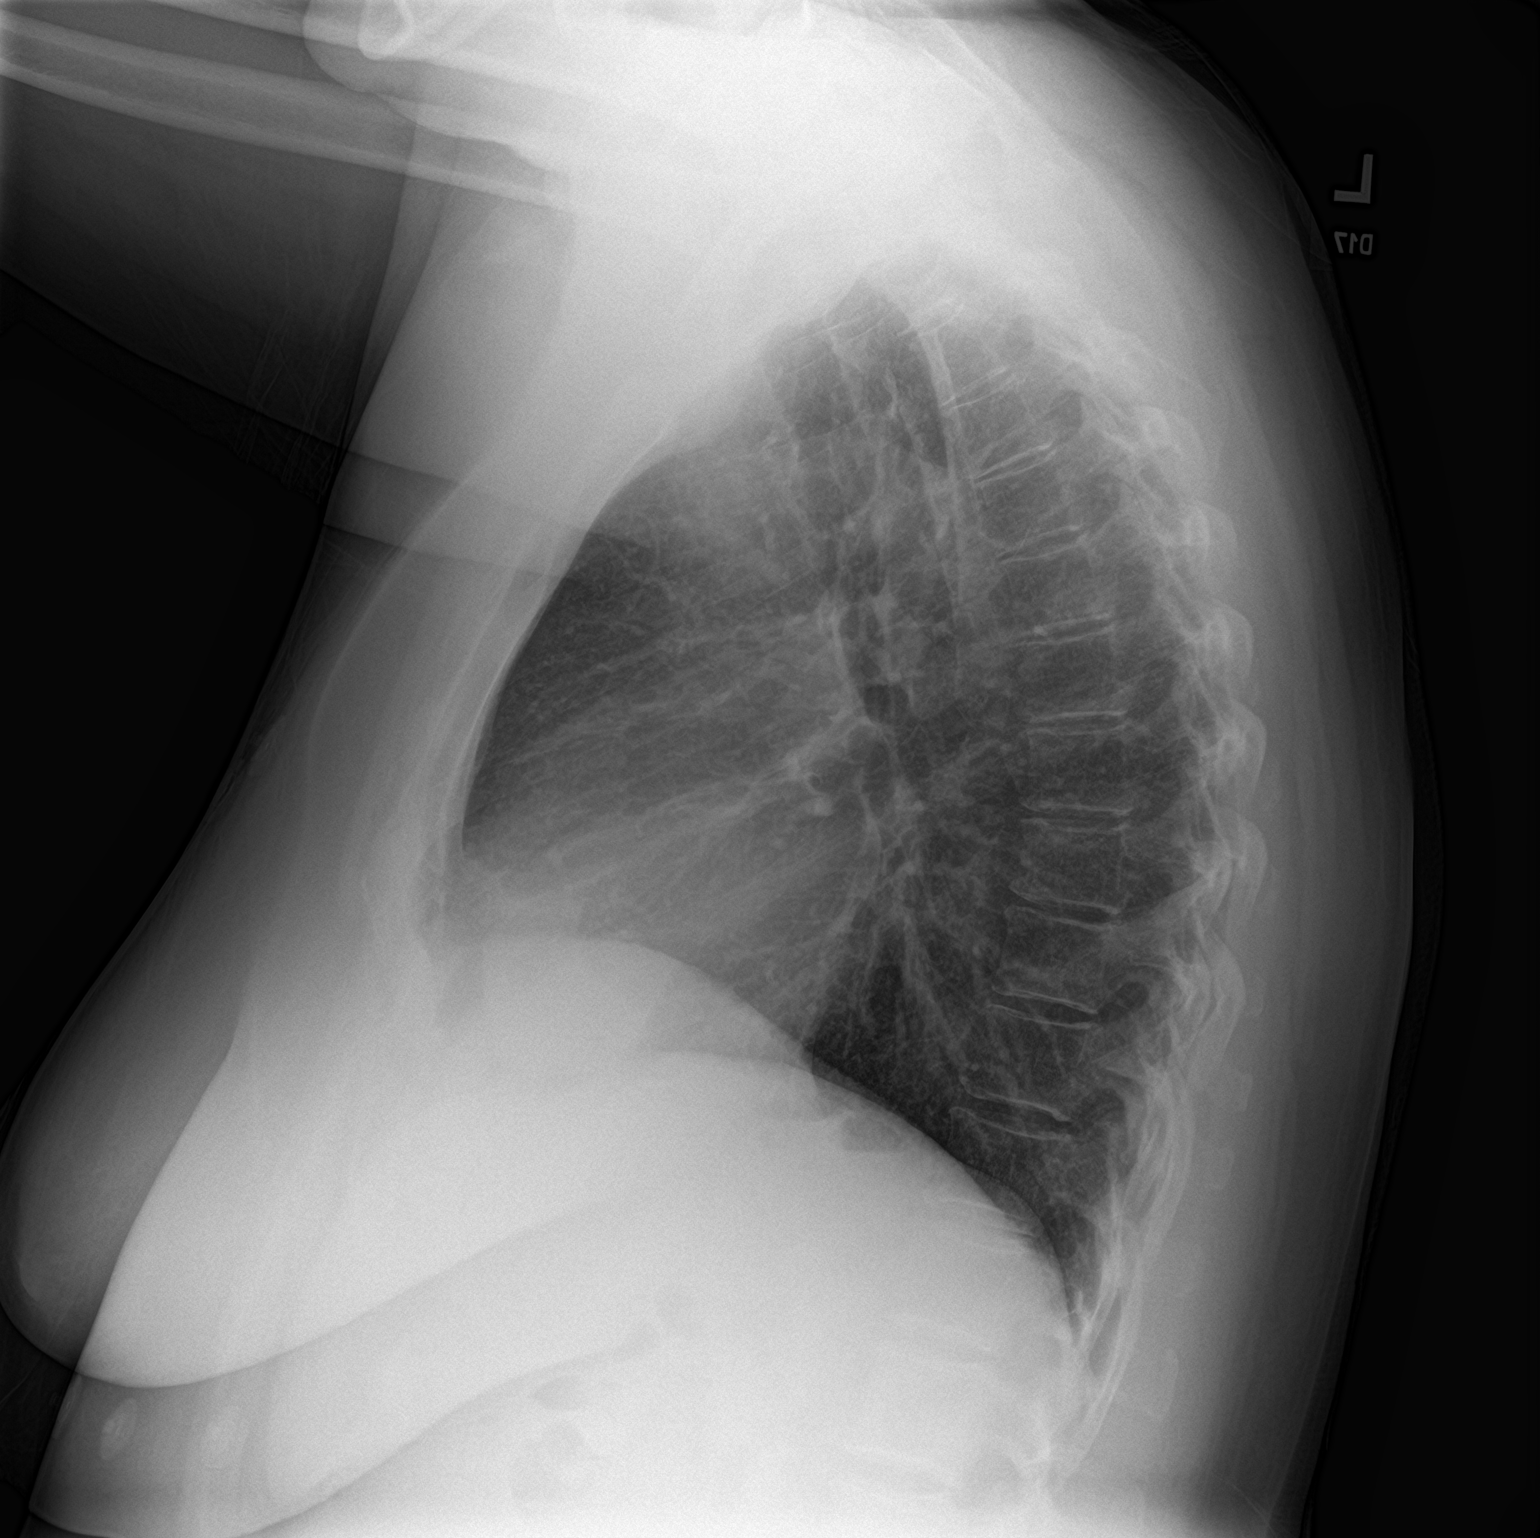

[2 of 2 positions shown; findings below may reference images not displayed]

FINDINGS: Stable cardiomediastinal silhouette with normal heart size. No
pneumothorax. No pleural effusion. Lungs appear clear, with no acute
consolidative airspace disease and no pulmonary edema.
IMPRESSION: No active cardiopulmonary disease.

## 2023-11-29 ENCOUNTER — Emergency Department

## 2023-11-29 ENCOUNTER — Emergency Department
Admission: EM | Admit: 2023-11-29 | Discharge: 2023-11-29 | Disposition: A | Discharge status: Home / Self Care | Attending: Emergency Medicine | Admitting: Emergency Medicine

## 2023-11-29 DIAGNOSIS — R059 Cough, unspecified: Secondary | ICD-10-CM

## 2023-11-29 DIAGNOSIS — H60332 Swimmer's ear, left ear: Secondary | ICD-10-CM

## 2023-11-29 DIAGNOSIS — R0602 Shortness of breath: Secondary | ICD-10-CM | POA: Diagnosis present

## 2023-11-29 DIAGNOSIS — J441 Chronic obstructive pulmonary disease with (acute) exacerbation: Secondary | ICD-10-CM | POA: Diagnosis not present

## 2023-11-29 DIAGNOSIS — J189 Pneumonia, unspecified organism: Secondary | ICD-10-CM | POA: Insufficient documentation

## 2023-11-29 LAB — CBC WITH DIFFERENTIAL/PLATELET
Abs Immature Granulocytes: 0.05 10*3/uL (ref 0.00–0.07)
Basophils Absolute: 0.1 10*3/uL (ref 0.0–0.1)
Basophils Relative: 1 %
Eosinophils Absolute: 0.5 10*3/uL (ref 0.0–0.5)
Eosinophils Relative: 4 %
HCT: 46.5 % — ABNORMAL HIGH (ref 36.0–46.0)
Hemoglobin: 15.9 g/dL — ABNORMAL HIGH (ref 12.0–15.0)
Immature Granulocytes: 0 %
Lymphocytes Relative: 29 %
Lymphs Abs: 4 10*3/uL (ref 0.7–4.0)
MCH: 31 pg (ref 26.0–34.0)
MCHC: 34.2 g/dL (ref 30.0–36.0)
MCV: 90.6 fL (ref 80.0–100.0)
Monocytes Absolute: 0.9 10*3/uL (ref 0.1–1.0)
Monocytes Relative: 6 %
Neutro Abs: 8.4 10*3/uL — ABNORMAL HIGH (ref 1.7–7.7)
Neutrophils Relative %: 60 %
Platelets: 275 10*3/uL (ref 150–400)
RBC: 5.13 MIL/uL — ABNORMAL HIGH (ref 3.87–5.11)
RDW: 13.5 % (ref 11.5–15.5)
WBC: 13.9 10*3/uL — ABNORMAL HIGH (ref 4.0–10.5)
nRBC: 0 % (ref 0.0–0.2)

## 2023-11-29 LAB — COMPREHENSIVE METABOLIC PANEL WITH GFR
ALT: 23 U/L (ref 0–44)
AST: 22 U/L (ref 15–41)
Albumin: 3.9 g/dL (ref 3.5–5.0)
Alkaline Phosphatase: 70 U/L (ref 38–126)
Anion gap: 12 (ref 5–15)
BUN: 7 mg/dL (ref 6–20)
CO2: 21 mmol/L — ABNORMAL LOW (ref 22–32)
Calcium: 8.8 mg/dL — ABNORMAL LOW (ref 8.9–10.3)
Chloride: 101 mmol/L (ref 98–111)
Creatinine, Ser: 0.66 mg/dL (ref 0.44–1.00)
GFR, Estimated: >60 mL/min (ref >60)
Glucose, Bld: 233 mg/dL — ABNORMAL HIGH (ref 70–99)
Potassium: 3.6 mmol/L (ref 3.5–5.1)
Sodium: 134 mmol/L — ABNORMAL LOW (ref 135–145)
Total Bilirubin: 0.7 mg/dL (ref 0.0–1.2)
Total Protein: 6.9 g/dL (ref 6.5–8.1)

## 2023-11-29 LAB — RESP PANEL BY RT-PCR (RSV, FLU A&B, COVID)  RVPGX2
Influenza A by PCR: NEGATIVE
Influenza B by PCR: NEGATIVE
Resp Syncytial Virus by PCR: NEGATIVE
SARS Coronavirus 2 by RT PCR: NEGATIVE

## 2023-11-29 LAB — BRAIN NATRIURETIC PEPTIDE: B Natriuretic Peptide: 27.3 pg/mL (ref 0.0–100.0)

## 2023-11-29 LAB — TROPONIN I (HIGH SENSITIVITY): Troponin I (High Sensitivity): 4 ng/L (ref <18)

## 2023-11-29 MED ORDER — ONDANSETRON 4 MG PO TBDP: 4.0000 mg | ORAL_TABLET | Freq: Three times a day (TID) | ORAL | 0 refills | Status: DC | PRN

## 2023-11-29 MED ORDER — PREDNISONE 20 MG PO TABS: 40.0000 mg | ORAL_TABLET | Freq: Every day | ORAL | 0 refills | Status: AC

## 2023-11-29 MED ORDER — ONDANSETRON 4 MG PO TBDP
4.0000 mg | ORAL_TABLET | Freq: Once | ORAL | Status: AC
  Administered 2023-11-29: 4 mg via ORAL
  Filled 2023-11-29: qty 1

## 2023-11-29 MED ORDER — IPRATROPIUM-ALBUTEROL 0.5-2.5 (3) MG/3ML IN SOLN
3.0000 mL | Freq: Once | RESPIRATORY_TRACT | Status: AC
  Administered 2023-11-29: 3 mL via RESPIRATORY_TRACT
  Filled 2023-11-29: qty 3

## 2023-11-29 MED ORDER — DOXYCYCLINE HYCLATE 100 MG PO TABS: 100.0000 mg | ORAL_TABLET | Freq: Two times a day (BID) | ORAL | 0 refills | Status: AC

## 2023-11-29 MED ORDER — ALBUTEROL SULFATE HFA 108 (90 BASE) MCG/ACT IN AERS: 2.0000 | INHALATION_SPRAY | RESPIRATORY_TRACT | 2 refills | Status: AC | PRN

## 2023-11-29 MED ORDER — AMOXICILLIN-POT CLAVULANATE 875-125 MG PO TABS
1.0000 | ORAL_TABLET | Freq: Once | ORAL | Status: AC
  Administered 2023-11-29: 1 via ORAL
  Filled 2023-11-29: qty 1

## 2023-11-29 MED ORDER — AMOXICILLIN-POT CLAVULANATE 875-125 MG PO TABS: 1.0000 | ORAL_TABLET | Freq: Two times a day (BID) | ORAL | 0 refills | Status: AC

## 2023-11-29 MED ORDER — DOXYCYCLINE HYCLATE 100 MG PO TABS
100.0000 mg | ORAL_TABLET | Freq: Once | ORAL | Status: AC
  Administered 2023-11-29: 100 mg via ORAL
  Filled 2023-11-29: qty 1

## 2023-11-29 NOTE — ED Triage Notes (Signed)
 Pt BIB EMS for SOB. Ems was called by patients home health. RA sats 86-87 per home health. 94% ith EMS. Pt given 2 duoneb, 2 albuterol, 125 solumedrol, 2G mag by EMS. Pt alert and oriented at triage.

## 2023-11-29 NOTE — Discharge Instructions (Addendum)
 Please take the steroids for 4 more days, since you received a dose today, you can start it tomorrow.  Please also take the antibiotics as prescribed for your pneumonia.  Please make sure to follow-up with your primary care doctor early next week to get reassessed.  I have also sent additional albuterol inhalers to your pharmacy.

## 2023-11-29 NOTE — ED Provider Notes (Signed)
 [name] Provider Note    Event Date/Time   First MD Initiated Contact with Patient 11/29/23 1257     (approximate)   History   Shortness of Breath   HPI  Gabrielle Ray is a 58 y.o. female with history of COPD not on home O2, history of fibromyalgia, presenting with shortness of breath for the last 2 days.  States that she has a chronic cough but cough had worsened last 2 days as well productive of clear sputum.  Denies any fever or chest pain, no leg swelling, denies history of CHF.  Is a current smoker.  Per independent history from EMS, home health reported that she was satting in the mid 80s on room air, for EMS she was 94%, she was given 2 DuoNebs, 2 albuterol's, 125 Solu-Medrol, 2 g of mag, symptoms improved significantly after interventions.  She denies any unilateral calf swelling or tenderness, no recent travel or surgeries, no history of malignancy.  Independent history obtained from EMS as above.  On independent chart review, seen by primary care doctor in early June for chronic cough, had been occurring for the last year, was instructed to stop the losartan, lung function testing was ordered, lung cancer screening CT was ordered.     Physical Exam   Triage Vital Signs: ED Triage Vitals  Encounter Vitals Group     BP 11/29/23 1259 123/74     Girls Systolic BP Percentile --      Girls Diastolic BP Percentile --      Boys Systolic BP Percentile --      Boys Diastolic BP Percentile --      Pulse Rate 11/29/23 1259 96     Resp 11/29/23 1259 20     Temp 11/29/23 1259 97.7 F (36.5 C)     Temp Source 11/29/23 1259 Oral     SpO2 11/29/23 1257 98 %     Weight 11/29/23 1258 238 lb (108 kg)     Height 11/29/23 1258 5' 6 (1.676 m)     Head Circumference --      Peak Flow --      Pain Score --      Pain Loc --      Pain Education --      Exclude from Growth Chart --     Most recent vital signs: Vitals:   11/29/23 1330 11/29/23 1430   BP: 127/79 (!) 135/95  Pulse: 89 78  Resp: 19 (!) 22  Temp:    SpO2: 97% 97%     General: Awake, no distress.  CV:  Good peripheral perfusion.  Resp:  Normal effort.  Clear Abd:  No distention.  Soft nontender Other:  No unilateral calf swelling or tenderness, no lower extremity edema   ED Results / Procedures / Treatments   Labs (all labs ordered are listed, but only abnormal results are displayed) Labs Reviewed  COMPREHENSIVE METABOLIC PANEL WITH GFR - Abnormal; Notable for the following components:      Result Value   Sodium 134 (*)    CO2 21 (*)    Glucose, Bld 233 (*)    Calcium 8.8 (*)    All other components within normal limits  CBC WITH DIFFERENTIAL/PLATELET - Abnormal; Notable for the following components:   WBC 13.9 (*)    RBC 5.13 (*)    Hemoglobin 15.9 (*)    HCT 46.5 (*)    Neutro Abs 8.4 (*)  All other components within normal limits  RESP PANEL BY RT-PCR (RSV, FLU A&B, COVID)  RVPGX2  BRAIN NATRIURETIC PEPTIDE  TROPONIN I (HIGH SENSITIVITY)     EKG  EKG shows, sinus rhythm, rate of 92, normal QRS, normal QTc, no ischemic ST elevation, T wave flattening to aVL, 3, aVF, T wave findings and compared to prior   RADIOLOGY On my independent interpretation, chest x-ray showed possible opacity on the left   PROCEDURES:  Critical Care performed: No  Procedures   MEDICATIONS ORDERED IN ED: Medications  doxycycline (VIBRA-TABS) tablet 100 mg (100 mg Oral Given 11/29/23 1309)  ipratropium-albuterol (DUONEB) 0.5-2.5 (3) MG/3ML nebulizer solution 3 mL (3 mLs Nebulization Given 11/29/23 1309)  amoxicillin -clavulanate (AUGMENTIN ) 875-125 MG per tablet 1 tablet (1 tablet Oral Given 11/29/23 1417)  ondansetron (ZOFRAN-ODT) disintegrating tablet 4 mg (4 mg Oral Given 11/29/23 1447)     IMPRESSION / MDM / ASSESSMENT AND PLAN / ED COURSE  I reviewed the triage vital signs and the nursing notes.                              Differential diagnosis  includes, but is not limited to, COPD exacerbation, pneumonia, viral illness, atypical ACS, consider CHF but patient does not appear volume overloaded at this time, also considered PE but patient has no other risk factors for this, given that she was wheezing and tight per EMS, favor COPD exacerbation at this time.  Will get labs, EKG, troponin, chest x-ray, will give her a DuoNeb, doxycycline for COPD exacerbation.  Will reassess.  Patient's presentation is most consistent with acute presentation with potential threat to life or bodily function.  Independent interpretation of labs and imaging below.  On reassessment patient shortness of breath is improved, no wheezing on exam.    Patient was observed, reassessed again, not hypoxic, not tachypneic, lungs are clear, she feels good.  Discussed with patient about imaging and lab results.  She was observed in the emergency department without additional nebulizers after her third DuoNeb, she is safe for outpatient management, will give her 4 more days of steroids as well as 10 days of doxycycline for COPD exacerbation.  Shared decision making to patient and she is agreeable with the plan, discussed with her about following up with her primary care doctor early next week for reassessment.  Strict return precautions given.  Considered but no indication for inpatient admission at this time, she is safe for outpatient management.  Will discharge.  The patient is on the cardiac monitor to evaluate for evidence of arrhythmia and/or significant heart rate changes.   Clinical Course as of 11/29/23 1501  Thu Nov 29, 2023  1347 Independent review of labs, troponins not elevated, electrolytes not severely deranged, LFTs are normal, mild leukocytosis. [TT]  1403 Resp panel by RT-PCR (RSV, Flu A&B, Covid) Anterior Nasal Swab Negative [TT]  1403 Brain natriuretic peptide Not elevated [TT]  1411 DG Chest 2 View IMPRESSION: New mild streaky opacity in posterior left  lower lobe, suspicious for pneumonia. Recommend continued chest radiographic follow-up to confirm resolution.   [TT]    Clinical Course User Index [TT] Way[place], Lorelle Cummins, MD     FINAL CLINICAL IMPRESSION(S) / ED DIAGNOSES   Final diagnoses:  COPD exacerbation (HCC)  Cough, unspecified type  Pneumonia due to infectious organism, unspecified laterality, unspecified part of lung     Rx / DC Orders   ED Discharge Orders  Ordered    predniSONE (DELTASONE) 20 MG tablet  Daily with breakfast        11/29/23 1433    doxycycline (VIBRA-TABS) 100 MG tablet  2 times daily        11/29/23 1433    amoxicillin -clavulanate (AUGMENTIN ) 875-125 MG tablet  2 times daily        11/29/23 1433    albuterol (VENTOLIN HFA) 108 (90 Base) MCG/ACT inhaler  Every 4 hours PRN        11/29/23 1434    ondansetron (ZOFRAN-ODT) 4 MG disintegrating tablet  Every 8 hours PRN        11/29/23 1454             Note:  This document was prepared using Dragon voice recognition software and may include unintentional dictation errors.    [place] Lorelle Cummins, MD 11/29/23 [phone]

## 2023-11-29 NOTE — ED Notes (Signed)
 Patient provided incentive spirometer and education to the patient on pneumonia by this nurse.

## 2023-12-17 ENCOUNTER — Observation Stay
Admission: EM | Admit: 2023-12-17 | Discharge: 2023-12-18 | Disposition: A | Discharge status: Home / Self Care | Attending: Internal Medicine | Admitting: Internal Medicine

## 2023-12-17 ENCOUNTER — Other Ambulatory Visit: Payer: Self-pay

## 2023-12-17 ENCOUNTER — Emergency Department

## 2023-12-17 DIAGNOSIS — J441 Chronic obstructive pulmonary disease with (acute) exacerbation: Secondary | ICD-10-CM | POA: Diagnosis not present

## 2023-12-17 DIAGNOSIS — Z79899 Other long term (current) drug therapy: Secondary | ICD-10-CM | POA: Diagnosis not present

## 2023-12-17 DIAGNOSIS — K219 Gastro-esophageal reflux disease without esophagitis: Secondary | ICD-10-CM | POA: Insufficient documentation

## 2023-12-17 DIAGNOSIS — R051 Acute cough: Secondary | ICD-10-CM | POA: Diagnosis not present

## 2023-12-17 DIAGNOSIS — G629 Polyneuropathy, unspecified: Secondary | ICD-10-CM

## 2023-12-17 DIAGNOSIS — M797 Fibromyalgia: Secondary | ICD-10-CM | POA: Diagnosis not present

## 2023-12-17 DIAGNOSIS — G9009 Other idiopathic peripheral autonomic neuropathy: Secondary | ICD-10-CM | POA: Diagnosis not present

## 2023-12-17 DIAGNOSIS — F17210 Nicotine dependence, cigarettes, uncomplicated: Secondary | ICD-10-CM | POA: Insufficient documentation

## 2023-12-17 DIAGNOSIS — Z6839 Body mass index (BMI) 39.0-39.9, adult: Secondary | ICD-10-CM | POA: Insufficient documentation

## 2023-12-17 DIAGNOSIS — E669 Obesity, unspecified: Secondary | ICD-10-CM | POA: Insufficient documentation

## 2023-12-17 DIAGNOSIS — I509 Heart failure, unspecified: Secondary | ICD-10-CM | POA: Insufficient documentation

## 2023-12-17 DIAGNOSIS — I10 Essential (primary) hypertension: Secondary | ICD-10-CM | POA: Insufficient documentation

## 2023-12-17 DIAGNOSIS — R0602 Shortness of breath: Secondary | ICD-10-CM | POA: Diagnosis present

## 2023-12-17 LAB — BASIC METABOLIC PANEL WITH GFR
Anion gap: 10 (ref 5–15)
Anion gap: 11 (ref 5–15)
BUN: 9 mg/dL (ref 6–20)
BUN: 8 mg/dL (ref 6–20)
CO2: 26 mmol/L (ref 22–32)
CO2: 24 mmol/L (ref 22–32)
Calcium: 9.3 mg/dL (ref 8.9–10.3)
Calcium: 9.1 mg/dL (ref 8.9–10.3)
Chloride: 105 mmol/L (ref 98–111)
Chloride: 105 mmol/L (ref 98–111)
Creatinine, Ser: 0.8 mg/dL (ref 0.44–1.00)
Creatinine, Ser: 0.72 mg/dL (ref 0.44–1.00)
GFR, Estimated: >60 mL/min (ref >60)
GFR, Estimated: >60 mL/min (ref >60)
Glucose, Bld: 122 mg/dL — ABNORMAL HIGH (ref 70–99)
Glucose, Bld: 182 mg/dL — ABNORMAL HIGH (ref 70–99)
Potassium: 3.8 mmol/L (ref 3.5–5.1)
Potassium: 3.7 mmol/L (ref 3.5–5.1)
Sodium: 141 mmol/L (ref 135–145)
Sodium: 140 mmol/L (ref 135–145)

## 2023-12-17 LAB — CBC WITH DIFFERENTIAL/PLATELET
Abs Immature Granulocytes: 0.03 K/uL (ref 0.00–0.07)
Basophils Absolute: 0 K/uL (ref 0.0–0.1)
Basophils Relative: 0 %
Eosinophils Absolute: 1.2 K/uL — ABNORMAL HIGH (ref 0.0–0.5)
Eosinophils Relative: 11 %
HCT: 44.2 % (ref 36.0–46.0)
Hemoglobin: 15 g/dL (ref 12.0–15.0)
Immature Granulocytes: 0 %
Lymphocytes Relative: 25 %
Lymphs Abs: 2.6 K/uL (ref 0.7–4.0)
MCH: 30.9 pg (ref 26.0–34.0)
MCHC: 33.9 g/dL (ref 30.0–36.0)
MCV: 91.1 fL (ref 80.0–100.0)
Monocytes Absolute: 0.7 K/uL (ref 0.1–1.0)
Monocytes Relative: 6 %
Neutro Abs: 6 K/uL (ref 1.7–7.7)
Neutrophils Relative %: 58 %
Platelets: 243 K/uL (ref 150–400)
RBC: 4.85 MIL/uL (ref 3.87–5.11)
RDW: 13.5 % (ref 11.5–15.5)
WBC: 10.5 K/uL (ref 4.0–10.5)
nRBC: 0 % (ref 0.0–0.2)

## 2023-12-17 LAB — CBC
HCT: 43 % (ref 36.0–46.0)
Hemoglobin: 14.4 g/dL (ref 12.0–15.0)
MCH: 30.8 pg (ref 26.0–34.0)
MCHC: 33.5 g/dL (ref 30.0–36.0)
MCV: 91.9 fL (ref 80.0–100.0)
Platelets: 232 K/uL (ref 150–400)
RBC: 4.68 MIL/uL (ref 3.87–5.11)
RDW: 13.6 % (ref 11.5–15.5)
WBC: 9.2 K/uL (ref 4.0–10.5)
nRBC: 0 % (ref 0.0–0.2)

## 2023-12-17 LAB — TROPONIN I (HIGH SENSITIVITY)
Troponin I (High Sensitivity): 5 ng/L (ref <18)
Troponin I (High Sensitivity): 6 ng/L (ref <18)

## 2023-12-17 LAB — BRAIN NATRIURETIC PEPTIDE: B Natriuretic Peptide: 14.8 pg/mL (ref 0.0–100.0)

## 2023-12-17 LAB — BLOOD GAS, VENOUS
Acid-Base Excess: 2.7 mmol/L — ABNORMAL HIGH (ref 0.0–2.0)
Bicarbonate: 28.9 mmol/L — ABNORMAL HIGH (ref 20.0–28.0)
O2 Saturation: 72.7 %
Patient temperature: 37
pCO2, Ven: 50 mmHg (ref 44–60)
pH, Ven: 7.37 (ref 7.25–7.43)
pO2, Ven: 43 mmHg (ref 32–45)

## 2023-12-17 LAB — HIV ANTIBODY (ROUTINE TESTING W REFLEX): HIV Screen 4th Generation wRfx: NONREACTIVE

## 2023-12-17 MED ORDER — PREDNISONE 20 MG PO TABS
60.0000 mg | ORAL_TABLET | Freq: Once | ORAL | Status: AC
  Administered 2023-12-17: 60 mg via ORAL
  Filled 2023-12-17: qty 3

## 2023-12-17 MED ORDER — PANTOPRAZOLE SODIUM 40 MG PO TBEC
40.0000 mg | DELAYED_RELEASE_TABLET | Freq: Every day | ORAL | Status: DC
  Administered 2023-12-17 – 2023-12-18 (×2): 40 mg via ORAL
  Filled 2023-12-17 (×2): qty 1

## 2023-12-17 MED ORDER — IPRATROPIUM-ALBUTEROL 0.5-2.5 (3) MG/3ML IN SOLN
3.0000 mL | Freq: Four times a day (QID) | RESPIRATORY_TRACT | Status: DC
  Administered 2023-12-17 – 2023-12-18 (×5): 3 mL via RESPIRATORY_TRACT
  Filled 2023-12-17 (×5): qty 3

## 2023-12-17 MED ORDER — TRAZODONE HCL 50 MG PO TABS
50.0000 mg | ORAL_TABLET | Freq: Every day | ORAL | Status: DC
  Administered 2023-12-17: 50 mg via ORAL
  Filled 2023-12-17: qty 1

## 2023-12-17 MED ORDER — METHOCARBAMOL 500 MG PO TABS: 500.0000 mg | ORAL_TABLET | Freq: Four times a day (QID) | ORAL | Status: DC

## 2023-12-17 MED ORDER — SODIUM CHLORIDE 0.9 % IV SOLN
1.0000 g | INTRAVENOUS | Status: DC
  Administered 2023-12-17: 1 g via INTRAVENOUS
  Filled 2023-12-17: qty 10

## 2023-12-17 MED ORDER — LOSARTAN POTASSIUM 25 MG PO TABS
25.0000 mg | ORAL_TABLET | Freq: Every day | ORAL | Status: DC
  Administered 2023-12-17 – 2023-12-18 (×2): 25 mg via ORAL
  Filled 2023-12-17 (×2): qty 1

## 2023-12-17 MED ORDER — ENOXAPARIN SODIUM 60 MG/0.6ML IJ SOSY
55.0000 mg | PREFILLED_SYRINGE | INTRAMUSCULAR | Status: DC
  Administered 2023-12-17 – 2023-12-18 (×2): 55 mg via SUBCUTANEOUS
  Filled 2023-12-17 (×2): qty 0.6

## 2023-12-17 MED ORDER — IPRATROPIUM-ALBUTEROL 0.5-2.5 (3) MG/3ML IN SOLN
6.0000 mL | Freq: Once | RESPIRATORY_TRACT | Status: AC
  Administered 2023-12-17: 6 mL via RESPIRATORY_TRACT
  Filled 2023-12-17 (×2): qty 3

## 2023-12-17 MED ORDER — MAGNESIUM HYDROXIDE 400 MG/5ML PO SUSP
30.0000 mL | Freq: Every day | ORAL | Status: DC | PRN
Start: 2023-12-17 — End: 2023-12-18

## 2023-12-17 MED ORDER — ACETAMINOPHEN 325 MG PO TABS: 650.0000 mg | ORAL_TABLET | Freq: Four times a day (QID) | ORAL | Status: DC | PRN

## 2023-12-17 MED ORDER — TRAZODONE HCL 50 MG PO TABS: 25.0000 mg | ORAL_TABLET | Freq: Every evening | ORAL | Status: DC | PRN

## 2023-12-17 MED ORDER — SODIUM CHLORIDE 0.9 % IV SOLN: INTRAVENOUS | Status: DC

## 2023-12-17 MED ORDER — ACETAMINOPHEN 650 MG RE SUPP: 650.0000 mg | Freq: Four times a day (QID) | RECTAL | Status: DC | PRN

## 2023-12-17 MED ORDER — PREDNISONE 20 MG PO TABS
40.0000 mg | ORAL_TABLET | Freq: Every day | ORAL | Status: DC
  Administered 2023-12-18: 40 mg via ORAL
  Filled 2023-12-17: qty 2

## 2023-12-17 MED ORDER — HYDROCOD POLI-CHLORPHE POLI ER 10-8 MG/5ML PO SUER
5.0000 mL | Freq: Two times a day (BID) | ORAL | Status: DC
  Administered 2023-12-17 – 2023-12-18 (×3): 5 mL via ORAL
  Filled 2023-12-17 (×3): qty 5

## 2023-12-17 MED ORDER — ONDANSETRON HCL 4 MG PO TABS: 4.0000 mg | ORAL_TABLET | Freq: Four times a day (QID) | ORAL | Status: DC | PRN

## 2023-12-17 MED ORDER — METHYLPREDNISOLONE SODIUM SUCC 40 MG IJ SOLR
40.0000 mg | Freq: Two times a day (BID) | INTRAMUSCULAR | Status: AC
  Administered 2023-12-17 (×2): 40 mg via INTRAVENOUS
  Filled 2023-12-17 (×2): qty 1

## 2023-12-17 MED ORDER — TRAMADOL HCL 50 MG PO TABS: 50.0000 mg | ORAL_TABLET | Freq: Two times a day (BID) | ORAL | Status: DC | PRN

## 2023-12-17 MED ORDER — ONDANSETRON HCL 4 MG/2ML IJ SOLN
4.0000 mg | Freq: Four times a day (QID) | INTRAMUSCULAR | Status: DC | PRN
Start: 2023-12-17 — End: 2023-12-18

## 2023-12-17 MED ORDER — TIZANIDINE HCL 2 MG PO TABS: 2.0000 mg | ORAL_TABLET | Freq: Four times a day (QID) | ORAL | Status: DC | PRN

## 2023-12-17 MED ORDER — ALBUTEROL SULFATE (2.5 MG/3ML) 0.083% IN NEBU
2.5000 mg | INHALATION_SOLUTION | Freq: Once | RESPIRATORY_TRACT | Status: AC
  Administered 2023-12-17: 2.5 mg via RESPIRATORY_TRACT
  Filled 2023-12-17: qty 3

## 2023-12-17 NOTE — H&P (Signed)
 [place]   PATIENT NAME: Gabrielle Ray    MR#:  [id number]  DATE OF BIRTH:  1966-02-26  DATE OF ADMISSION:  12/17/2023  PRIMARY CARE PHYSICIAN: Lizette Lango, MD   Patient is coming from: Home  REQUESTING/REFERRING PHYSICIAN: Willo Dunnings, MD  CHIEF COMPLAINT:   Chief Complaint  Patient presents with   Shortness of Breath    HISTORY OF PRESENT ILLNESS:  Gabrielle Ray is a 58 y.o. female with medical history significant for fibromyalgia and hypertension as well as COPD, who presented to the emergency room with acute onset of worsening dyspnea with associated dry cough and wheezing which have been going on for a while and worsened significantly since yesterday.  Patient admitted to recent pneumonia for which she finished a course of antibiotic on July 6.  No chest pain or palpitations.  No fever or chills.  No nausea or vomiting or abdominal pain.  No dysuria, oliguria or hematuria or flank pain.  No bleeding diathesis.  ED Course: When the patient came to the ER vital signs were within normal.  Labs revealed unremarkable BMP and CBC.  High sensitive troponin I was 5 and later 6 and BNP 14.8. EKG as reviewed by me : EKG showed right and left electrode reversal, probable right ventricular hypertrophy.  It showed a sinus rhythm with a rate of 84. Imaging: Portable chest x-ray showed no acute cardiopulmonary disease.  The patient was given nebulized albuterol , DuoNeb and 60 mg p.o. prednisone .  She will be admitted to a medical telemetry bed for further evaluation and management. PAST MEDICAL HISTORY:   Past Medical History:  Diagnosis Date   Fibromyalgia    Hypertension   -COPD  PAST SURGICAL HISTORY:  No past surgical history on file.  SOCIAL HISTORY:   Social History   Tobacco Use   Smoking status: Every Day    Current packs/day: 1.00    Types: Cigarettes   Smokeless tobacco: Not on file  Substance Use Topics   Alcohol use: No    FAMILY HISTORY:   Positive for diabetes mellitus, hypertension, CVA, cancer and coronary artery disease.  DRUG ALLERGIES:  No Known Allergies  REVIEW OF SYSTEMS:   ROS As per history of present illness. All pertinent systems were reviewed above. Constitutional, HEENT, cardiovascular, respiratory, GI, GU, musculoskeletal, neuro, psychiatric, endocrine, integumentary and hematologic systems were reviewed and are otherwise negative/unremarkable except for positive findings mentioned above in the HPI.   MEDICATIONS AT HOME:   Prior to Admission medications   Medication Sig Start Date End Date Taking? Authorizing Provider  albuterol  (VENTOLIN  HFA) 108 (90 Base) MCG/ACT inhaler Inhale 2 puffs into the lungs every 4 (four) hours as needed for wheezing or shortness of breath. 11/29/23   Waymond Lorelle Cummins, MD  amitriptyline (ELAVIL) 75 MG tablet Take 75 mg by mouth at bedtime. 05/06/21   [provider]  ciprofloxacin -hydrocortisone (CIPRO  HC) OTIC suspension Place 3 drops into the left ear 2 (two) times daily. For 7 days Patient not taking: Reported on 07/21/2021 12/19/18   Rowland Norleen BROCKS, FNP  esomeprazole (NEXIUM) 40 MG capsule Take 40 mg by mouth 2 (two) times daily. 07/05/21   [provider]  losartan  (COZAAR ) 25 MG tablet Take 25 mg by mouth daily. 07/05/21   [provider]  meloxicam  (MOBIC ) 15 MG tablet Take 1 tablet (15 mg total) by mouth daily. 09/10/15   Cuthriell, Dorn BIRCH, PA-C  methocarbamol  (ROBAXIN ) 500 MG tablet Take 1  tablet (500 mg total) by mouth 4 (four) times daily. 09/10/15   Cuthriell, Dorn BIRCH, PA-C  ondansetron  (ZOFRAN -ODT) 4 MG disintegrating tablet Take 1 tablet (4 mg total) by mouth every 8 (eight) hours as needed for nausea or vomiting. 11/29/23   Waymond Lorelle Cummins, MD  OZEMPIC, 1 MG/DOSE, 4 MG/3ML SOPN Inject into the skin. 05/17/21   [provider]  tiZANidine  (ZANAFLEX ) 2 MG tablet Take 4 mg by mouth 2 (two) times daily. 07/05/21   [provider]   traMADol  (ULTRAM ) 50 MG tablet Take 50 mg by mouth 2 (two) times daily as needed. 07/05/21   [provider]  traZODone  (DESYREL ) 50 MG tablet Take 50 mg by mouth at bedtime. 07/05/21   [provider]  TRULICITY 1.5 MG/0.5ML SOPN Inject 0.5 mLs into the skin once a week. 07/20/21   [provider]      VITAL SIGNS:  Blood pressure 134/70, pulse 92, temperature 97.8 F (36.6 C), temperature source Oral, resp. rate (!) 21, height 5' 6 (1.676 m), weight 110.9 kg, SpO2 95%.  PHYSICAL EXAMINATION:  Physical Exam  GENERAL:  58 y.o.-year-old Caucasian female patient lying in the bed with no acute distress.  EYES: Pupils equal, round, reactive to light and accommodation. No scleral icterus. Extraocular muscles intact.  HEENT: Head atraumatic, normocephalic. Oropharynx and nasopharynx clear.  NECK:  Supple, no jugular venous distention. No thyroid enlargement, no tenderness.  LUNGS: Diffuse expiratory wheezes with tight expiratory airflow and harsh vesicular breathing.  No use of accessory muscles of respiration.  CARDIOVASCULAR: Regular rate and rhythm, S1, S2 normal. No murmurs, rubs, or gallops.  ABDOMEN: Soft, nondistended, nontender. Bowel sounds present. No organomegaly or mass.  EXTREMITIES: No pedal edema, cyanosis, or clubbing.  NEUROLOGIC: Cranial nerves II through XII are intact. Muscle strength 5/5 in all extremities. Sensation intact. Gait not checked.  PSYCHIATRIC: The patient is alert and oriented x 3.  Normal affect and good eye contact. SKIN: No obvious rash, lesion, or ulcer.   LABORATORY PANEL:   CBC Recent Labs  Lab 12/17/23 0033  WBC 10.5  HGB 15.0  HCT 44.2  PLT 243   ------------------------------------------------------------------------------------------------------------------  Chemistries  Recent Labs  Lab 12/17/23 0033  NA 141  K 3.8  CL 105  CO2 26  GLUCOSE 122*  BUN 9  CREATININE 0.80  CALCIUM 9.3    ------------------------------------------------------------------------------------------------------------------  Cardiac Enzymes No results for input(s): TROPONINI in the last 168 hours. ------------------------------------------------------------------------------------------------------------------  RADIOLOGY:  DG Chest Portable 1 View Result Date: 12/17/2023 CLINICAL DATA:  Dyspnea EXAM: PORTABLE CHEST 1 VIEW COMPARISON:  None Available. FINDINGS: The heart size and mediastinal contours are within normal limits. Both lungs are clear. The visualized skeletal structures are unremarkable. IMPRESSION: No active disease. Electronically Signed   By: Dorethia Molt M.D.   On: 12/17/2023 00:58      IMPRESSION AND PLAN:  Assessment and Plan: * COPD exacerbation (HCC) - The patient will be admitted to a medically monitored bed. - We will place the patient IV steroid therapy with IV Solu-Medrol  as well as nebulized bronchodilator therapy with duonebs q.i.d. and q.4 hours p.r.n.SABRA - Mucolytic therapy will be provided with Mucinex and antibiotic therapy with IV Rocephin . -Will continue Spiriva. - O2 protocol will be followed.   Essential hypertension - Will continue  her antihypertensive therapy.  GERD without esophagitis Will continue your Neurontin.  Peripheral neuropathy - Will continue Neurontin.   DVT prophylaxis: Lovenox .  Advanced Care Planning:  Code Status: full  code.  Family Communication:  The plan of care was discussed in details with the patient (and family). I answered all questions. The patient agreed to proceed with the above mentioned plan. Further management will depend upon hospital course. Disposition Plan: Back to previous home environment Consults called: none.  All the records are reviewed and case discussed with ED provider.  Status is: Inpatient  At the time of the admission, it appears that the appropriate admission status for this patient is  inpatient.  This is judged to be reasonable and necessary in order to provide the required intensity of service to ensure the patient's safety given the presenting symptoms, physical exam findings and initial radiographic and laboratory data in the context of comorbid conditions.  The patient requires inpatient status due to high intensity of service, high risk of further deterioration and high frequency of surveillance required.  I certify that at the time of admission, it is my clinical judgment that the patient will require inpatient hospital care extending more than 2 midnights.                            Dispo: The patient is from: Home              Anticipated d/c is to: Home              Patient currently is not medically stable to d/c.              Difficult to place patient: No  Madison DELENA Peaches M.D on 12/17/2023 at 5:20 AM  [facility]   From 7 PM-7 AM, contact night-coverage [email or web address]  CC: Primary care physician; Lam, Yee, MD

## 2023-12-17 NOTE — Hospital Course (Signed)
 Gabrielle Ray is a 58 y.o. female with medical history significant for fibromyalgia and hypertension as well as COPD, who presented to the emergency room with acute onset of worsening dyspnea with associated dry cough and wheezing.  He was diagnosed with COPD exacerbation, was started on steroids, scheduled bronchodilator. Condition improved, increased mucous production, no hypoxia. Medically stable for discharge.

## 2023-12-17 NOTE — ED Provider Notes (Signed)
 [facility] Provider Note    Event Date/Time   First MD Initiated Contact with Patient 12/17/23 [phone]     (approximate)   History   Chief Complaint Shortness of Breath   HPI  Gabrielle Ray is a 58 y.o. female with past medical history of hypertension, fibromyalgia, and COPD who presents to the ED complaining of shortness of breath.  Patient reports that she has had 24 hours of a dry cough with increasing difficulty breathing.  She denies any associated fevers, did have some pain in her chest last night when coughing, but states this has since resolved.  She has not noticed any pain or swelling in her legs, has been using albuterol  at home without significant relief.  EMS found patient with oxygen saturations of 90% on room air and she was placed on 2 L nasal cannula with improvement.  She was also given 1 DuoNeb prior to arrival.     Physical Exam   Triage Vital Signs: ED Triage Vitals  Encounter Vitals Group     BP      Girls Systolic BP Percentile      Girls Diastolic BP Percentile      Boys Systolic BP Percentile      Boys Diastolic BP Percentile      Pulse      Resp      Temp      Temp src      SpO2      Weight      Height      Head Circumference      Peak Flow      Pain Score      Pain Loc      Pain Education      Exclude from Growth Chart     Most recent vital signs: Vitals:   12/17/23 0100 12/17/23 0130  BP: 128/66 (!) 106/56  Pulse: 84 89  Resp: (!) 25 16  Temp:    SpO2: 96% 95%    Constitutional: Alert and oriented. Eyes: Conjunctivae are normal. Head: Atraumatic. Nose: No congestion/rhinnorhea. Mouth/Throat: Mucous membranes are moist.  Cardiovascular: Normal rate, regular rhythm. Grossly normal heart sounds.  2+ radial pulses bilaterally. Respiratory: Tachypneic with mildly increased respiratory effort.  No retractions. Lungs with inspiratory and expiratory wheezing throughout. Gastrointestinal: Soft and nontender.  No distention. Musculoskeletal: No lower extremity tenderness nor edema.  Neurologic:  Normal speech and language. No gross focal neurologic deficits are appreciated.    ED Results / Procedures / Treatments   Labs (all labs ordered are listed, but only abnormal results are displayed) Labs Reviewed  BLOOD GAS, VENOUS - Abnormal; Notable for the following components:      Result Value   Bicarbonate 28.9 (*)    Acid-Base Excess 2.7 (*)    All other components within normal limits  CBC WITH DIFFERENTIAL/PLATELET - Abnormal; Notable for the following components:   Eosinophils Absolute 1.2 (*)    All other components within normal limits  BASIC METABOLIC PANEL WITH GFR - Abnormal; Notable for the following components:   Glucose, Bld 122 (*)    All other components within normal limits  BRAIN NATRIURETIC PEPTIDE  TROPONIN I (HIGH SENSITIVITY)  TROPONIN I (HIGH SENSITIVITY)     EKG  ED ECG REPORT I, Carlin Palin, the attending physician, personally viewed and interpreted this ECG.   Date: 12/17/2023  EKG Time: 00:33  Rate: 78  Rhythm: normal sinus rhythm  Axis: Normal  Intervals:none  ST&T Change: None  RADIOLOGY Chest x-ray reviewed and interpreted by me with no infiltrate, edema, or effusion.  PROCEDURES:  Critical Care performed: No  Procedures   MEDICATIONS ORDERED IN ED: Medications  albuterol  (PROVENTIL ) (2.5 MG/3ML) 0.083% nebulizer solution 2.5 mg (has no administration in time range)  ipratropium-albuterol  (DUONEB) 0.5-2.5 (3) MG/3ML nebulizer solution 6 mL (6 mLs Nebulization Given 12/17/23 0037)  predniSONE  (DELTASONE ) tablet 60 mg (60 mg Oral Given 12/17/23 0037)     IMPRESSION / MDM / ASSESSMENT AND PLAN / ED COURSE  I reviewed the triage vital signs and the nursing notes.                              58 y.o. female with past medical history of hypertension, COPD, and fibromyalgia who presents to the ED complaining of dry cough with increasing  difficulty breathing over the past 24 hours.  Patient's presentation is most consistent with acute presentation with potential threat to life or bodily function.  Differential diagnosis includes, but is not limited to, COPD exacerbation, CHF, ACS, PE, anemia, electrolyte abnormality, AKI, pneumonia.  Patient in mild respiratory distress, currently maintaining oxygen saturations on 2 L, does have significant wheezing.  EKG shows no evidence of arrhythmia or ischemia, labs and chest x-ray are pending at this time.  We will treat with prednisone  and give additional DuoNebs.  Patient with improved wheezing and work of breathing on reassessment, chest x-ray is unremarkable.  Labs without significant anemia, leukocytosis, electrolyte abnormality, or AKI.  Troponin and BNP within normal limits, doubt ACS or PE.  VBG is also reassuring.  Patient was feeling better for a little while, but then had recurrent wheezing with increasing difficulty breathing, will give additional albuterol .  Case discussed with hospitalist for admission.      FINAL CLINICAL IMPRESSION(S) / ED DIAGNOSES   Final diagnoses:  COPD exacerbation (HCC)     Rx / DC Orders   ED Discharge Orders     None        Note:  This document was prepared using Dragon voice recognition software and may include unintentional dictation errors.   Willo Dunnings, MD 12/17/23 [phone]

## 2023-12-17 NOTE — ED Triage Notes (Signed)
 Pt BIB by EMS reporting SOB all day. Pt states that she has a hx of COPD, and a recent dx of pneumonia (recently finished ABX).  Upon EMS arrival pt was 92% on RA, placed on 4L w/ improvement to 97%. Pt now on 2L nasal canula at 97%. 1 Duoneb tx given by EMS.

## 2023-12-17 NOTE — Assessment & Plan Note (Signed)
Will continue Neurontin.

## 2023-12-17 NOTE — ED Notes (Signed)
 This NT assisted pt to the BR.

## 2023-12-17 NOTE — Assessment & Plan Note (Signed)
-   Will continue her antihypertensive therapy.

## 2023-12-17 NOTE — Progress Notes (Signed)
 PHARMACIST - PHYSICIAN COMMUNICATION  CONCERNING:  Enoxaparin  (Lovenox ) for DVT Prophylaxis    RECOMMENDATION: Patient was prescribed enoxaprin 40mg  q24 hours for VTE prophylaxis.   Filed Weights   12/17/23 0030  Weight: 110.9 kg (244 lb 9.6 oz)    Body mass index is 39.48 kg/m.  Estimated Creatinine Clearance: 98 mL/min (by C-G formula based on SCr of 0.8 mg/dL).   Based on [facility] policy patient is candidate for enoxaparin  0.5mg /kg TBW SQ every 24 hours based on BMI being >30.  DESCRIPTION: Pharmacy has adjusted enoxaparin  dose per [facility] policy.  Patient is now receiving enoxaparin  0.5 mg/kg every 24 hours   [place] CANDIE Dills, PharmD, [facility] 12/17/2023 3:26 AM

## 2023-12-17 NOTE — Assessment & Plan Note (Addendum)
-   The patient will be admitted to a medically monitored bed. - We will place the patient IV steroid therapy with IV Solu-Medrol  as well as nebulized bronchodilator therapy with duonebs q.i.d. and q.4 hours p.r.n.SABRA - Mucolytic therapy will be provided with Mucinex and antibiotic therapy with IV Rocephin . -Will continue Spiriva. - O2 protocol will be followed.

## 2023-12-17 NOTE — Progress Notes (Signed)
  Progress Note   Patient: Gabrielle Ray FMW:[id number] DOB: [birth year] DOA: 12/17/2023     0 DOS: the patient was seen and examined on 12/17/2023   Brief hospital course: CATY TESSLER is a 58 y.o. female with medical history significant for fibromyalgia and hypertension as well as COPD, who presented to the emergency room with acute onset of worsening dyspnea with associated dry cough and wheezing.  He was diagnosed with COPD exacerbation, was started on steroids, scheduled bronchodilator.   Principal Problem:   COPD exacerbation (HCC) Active Problems:   Peripheral neuropathy   GERD without esophagitis   Essential hypertension   Assessment and Plan:  * COPD exacerbation (HCC) Tobacco abuse. Patient had worsening short of breath for the last 3 or 4 days, no evidence of congestive heart failure with normal BNP.  Patient also has severe nonproductive cough, which has made her breathing difficulty. Continue steroids, scheduled bronchodilator.  Also add Sinex. Anticipating discharge in next 1 to 2 days.  Class II obesity with BMI 39.48. Diet and exercise advised.  Essential hypertension - Will continue  her antihypertensive therapy.  GERD without esophagitis Will continue your Neurontin.  Peripheral neuropathy - Will continue Neurontin.      Subjective:  Still has severe short of breath, wheezing, cough, nonproductive.  Physical Exam: Vitals:   12/17/23 0445 12/17/23 0500 12/17/23 0718 12/17/23 0800  BP:  135/64  128/65  Pulse: 92 88  87  Resp: (!) 21 (!) 22  (!) 25  Temp:   97.9 F (36.6 C)   TempSrc:   Oral   SpO2: 95% 94%  96%  Weight:      Height:       General exam: Appears calm and comfortable  Respiratory system: Significant decreased breath sounds with little air movement.SABRA Respiratory effort normal. Cardiovascular system: S1 & S2 heard, RRR. No JVD, murmurs, rubs, gallops or clicks. No pedal edema. Gastrointestinal system: Abdomen is nondistended,  soft and nontender. No organomegaly or masses felt. Normal bowel sounds heard. Central nervous system: Alert and oriented. No focal neurological deficits. Extremities: Symmetric 5 x 5 power. Skin: No rashes, lesions or ulcers Psychiatry: Judgement and insight appear normal. Mood & affect appropriate.    Data Reviewed:  X-ray and lab results reviewed.  Family Communication: None  Disposition: Status is: Inpatient Remains inpatient appropriate because: Severity of disease, IV treatment     Time spent: no charge minutes  Author: Murvin Mana, MD 12/17/2023 10:16 AM  For on call review [email or web address].

## 2023-12-17 NOTE — Assessment & Plan Note (Signed)
 Will continue your Neurontin.

## 2023-12-17 NOTE — ED Notes (Signed)
 Patient ambulated to bathroom without assistance from staff. Maintained O2 levels on 2 L of oxygen while out of bed. Pt returned to bed, bed is in lowest locked position, call bell within reach. Pt tolerated activity well.

## 2023-12-18 ENCOUNTER — Other Ambulatory Visit: Payer: Self-pay

## 2023-12-18 DIAGNOSIS — K219 Gastro-esophageal reflux disease without esophagitis: Secondary | ICD-10-CM | POA: Diagnosis not present

## 2023-12-18 DIAGNOSIS — J441 Chronic obstructive pulmonary disease with (acute) exacerbation: Secondary | ICD-10-CM | POA: Diagnosis not present

## 2023-12-18 DIAGNOSIS — I10 Essential (primary) hypertension: Secondary | ICD-10-CM | POA: Diagnosis not present

## 2023-12-18 MED ORDER — IPRATROPIUM-ALBUTEROL 0.5-2.5 (3) MG/3ML IN SOLN: 3.0000 mL | RESPIRATORY_TRACT | Status: DC | PRN

## 2023-12-18 MED ORDER — HYDROCOD POLI-CHLORPHE POLI ER 10-8 MG/5ML PO SUER
5.0000 mL | Freq: Two times a day (BID) | ORAL | 0 refills | Status: AC
  Filled 2023-12-18: qty 30, 3d supply, fill #0

## 2023-12-18 MED ORDER — PREDNISONE 20 MG PO TABS
ORAL_TABLET | ORAL | 0 refills | Status: AC
  Filled 2023-12-18: qty 9, 8d supply, fill #0

## 2023-12-18 MED ORDER — IPRATROPIUM-ALBUTEROL 0.5-2.5 (3) MG/3ML IN SOLN
3.0000 mL | Freq: Four times a day (QID) | RESPIRATORY_TRACT | 0 refills | Status: AC
  Filled 2023-12-18: qty 90, 8d supply, fill #0

## 2023-12-18 NOTE — Care Management CC44 (Signed)
 Condition Code 44 Documentation Completed  Patient Details  Name: Gabrielle Ray MRN: [id number] Date of Birth: 1966/04/08   Condition Code 44 given:  Yes Patient signature on Condition Code 44 notice:  Yes Documentation of 2 MD's agreement:  Yes Code 44 added to claim:  Yes    Tomasa JAYSON Childes, RN 12/18/2023, 8:54 AM

## 2023-12-18 NOTE — Care Management Obs Status (Signed)
 MEDICARE OBSERVATION STATUS NOTIFICATION   Patient Details  Name: Gabrielle Ray MRN: [id number] Date of Birth: [birth year]   Medicare Observation Status Notification Given:  Yes    Tomasa JAYSON Childes, RN 12/18/2023, 8:54 AM

## 2023-12-18 NOTE — Discharge Summary (Signed)
 Physician Discharge Summary   Patient: Gabrielle Ray MRN: [id number] DOB: 1965-08-15  Admit date:     12/17/2023  Discharge date: 12/18/23  Discharge Physician: Murvin Mana   PCP: Lizette Lango, MD   Recommendations at discharge:    Follow with PCP in 1 week  Discharge Diagnoses: Principal Problem:   COPD exacerbation (HCC) Active Problems:   Peripheral neuropathy   GERD without esophagitis   Essential hypertension  Resolved Problems:   * No resolved hospital problems. *  Hospital Course: Gabrielle Ray is a 58 y.o. female with medical history significant for fibromyalgia and hypertension as well as COPD, who presented to the emergency room with acute onset of worsening dyspnea with associated dry cough and wheezing.  He was diagnosed with COPD exacerbation, was started on steroids, scheduled bronchodilator. Condition improved, increased mucous production, no hypoxia. Medically stable for discharge.  Assessment and Plan:  COPD exacerbation (HCC) Tobacco abuse. Patient had worsening short of breath for the last 3 or 4 days, no evidence of congestive heart failure with normal BNP.  Patient also has severe nonproductive cough, which has made her breathing difficulty. Continue steroids, scheduled bronchodilator.  Also add Tussionex, x 3days Condition improved. Patient is also advised not to take tramadol  while on tussionex.    Class II obesity with BMI 39.48. Diet and exercise advised.   Essential hypertension - Will continue  her antihypertensive therapy.   GERD without esophagitis Will continue your Neurontin.   Peripheral neuropathy - Will continue Neurontin.       Consultants: None Procedures performed: None  Disposition: Home Diet recommendation:  Discharge Diet Orders (From admission, onward)     Start     Ordered   12/18/23 0000  Diet - low sodium heart healthy        12/18/23 0823           Cardiac diet DISCHARGE MEDICATION: Allergies as of  12/18/2023   No Known Allergies      Medication List     STOP taking these medications    meloxicam  15 MG tablet Commonly known as: MOBIC    ondansetron  4 MG disintegrating tablet Commonly known as: ZOFRAN -ODT   Ozempic (1 MG/DOSE) 4 MG/3ML Sopn Generic drug: Semaglutide (1 MG/DOSE)   traMADol  50 MG tablet Commonly known as: ULTRAM        TAKE these medications    albuterol  108 (90 Base) MCG/ACT inhaler Commonly known as: VENTOLIN  HFA Inhale 2 puffs into the lungs every 4 (four) hours as needed for wheezing or shortness of breath.   amitriptyline 75 MG tablet Commonly known as: ELAVIL Take 75 mg by mouth at bedtime.   chlorpheniramine-HYDROcodone 10-8 MG/5ML Commonly known as: TUSSIONEX Take 5 mLs by mouth every 12 (twelve) hours for 3 days.   esomeprazole 40 MG capsule Commonly known as: NEXIUM Take 40 mg by mouth 2 (two) times daily.   estradiol 0.025 MG/24HR Commonly known as: VIVELLE-DOT Place 1 patch onto the skin 2 (two) times a week.   gabapentin 100 MG capsule Commonly known as: NEURONTIN Take 100 mg by mouth at bedtime.   ipratropium-albuterol  0.5-2.5 (3) MG/3ML Soln Commonly known as: DUONEB Take 3 mLs by nebulization every 6 (six) hours for 7 days.   losartan  25 MG tablet Commonly known as: COZAAR  Take 25 mg by mouth daily.   predniSONE  20 MG tablet Commonly known as: DELTASONE  Take 2 tablets (40 mg total) by mouth daily with breakfast for 2 days, THEN 1 tablet (20 mg  total) daily with breakfast for 3 days, THEN 0.5 tablets (10 mg total) daily with breakfast for 3 days. Start taking on: December 19, 2023   Spiriva Respimat 2.5 MCG/ACT Aers Generic drug: Tiotropium Bromide Monohydrate Take 2 puffs by mouth daily.   tiZANidine  2 MG tablet Commonly known as: ZANAFLEX  Take 4 mg by mouth 2 (two) times daily as needed for muscle spasms.   traZODone  50 MG tablet Commonly known as: DESYREL  Take 50 mg by mouth at bedtime.   Trulicity 1.5 MG/0.5ML  Soaj Generic drug: Dulaglutide Inject 0.5 mLs into the skin once a week.               Durable Medical Equipment  (From admission, onward)           Start     Ordered   12/18/23 0822  For home use only DME Nebulizer machine  Once       Question Answer Comment  Patient needs a nebulizer to treat with the following condition COPD exacerbation (HCC)   Length of Need 6 Months   Additional equipment included Administration kit      12/18/23 0823            Follow-up Information     Lizette Lango, MD Follow up in 1 week(s).   Specialty: Family Medicine Contact information: [address] [facility] [id number] Physician Office Building [place] [address] [phone]                Discharge Exam: Gabrielle Ray   12/17/23 0030 12/17/23 2151  Weight: 110.9 kg 108.4 kg   General exam: Appears calm and comfortable  Respiratory system: Clear to auscultation, much improved. Respiratory effort normal. Cardiovascular system: S1 & S2 heard, RRR. No JVD, murmurs, rubs, gallops or clicks. No pedal edema. Gastrointestinal system: Abdomen is nondistended, soft and nontender. No organomegaly or masses felt. Normal bowel sounds heard. Central nervous system: Alert and oriented. No focal neurological deficits. Extremities: Symmetric 5 x 5 power. Skin: No rashes, lesions or ulcers Psychiatry: Judgement and insight appear normal. Mood & affect appropriate.    Condition at discharge: good  The results of significant diagnostics from this hospitalization (including imaging, microbiology, ancillary and laboratory) are listed below for reference.   Imaging Studies: DG Chest Portable 1 View Result Date: 12/17/2023 CLINICAL DATA:  Dyspnea EXAM: PORTABLE CHEST 1 VIEW COMPARISON:  None Available. FINDINGS: The heart size and mediastinal contours are within normal limits. Both lungs are clear. The visualized skeletal structures are unremarkable. IMPRESSION: No active disease.  Electronically Signed   By: Dorethia Molt M.D.   On: 12/17/2023 00:58   DG Chest 2 View Result Date: 11/29/2023 CLINICAL DATA:  Shortness of breath.  Cough. EXAM: CHEST - 2 VIEW COMPARISON:  07/21/2021 FINDINGS: The heart size and mediastinal contours are within normal limits. Mild streaky opacity is seen in the posterior left lower lobe which appears new, suspicious for pneumonia. Right lung is clear. No pleural effusion. IMPRESSION: New mild streaky opacity in posterior left lower lobe, suspicious for pneumonia. Recommend continued chest radiographic follow-up to confirm resolution. Electronically Signed   By: Norleen DELENA Kil M.D.   On: 11/29/2023 14:09    Microbiology: Results for orders placed or performed during the hospital encounter of 11/29/23  Resp panel by RT-PCR (RSV, Flu A&B, Covid) Anterior Nasal Swab     Status: None   Collection Time: 11/29/23  1:06 PM   Specimen: Anterior Nasal Swab  Result Value Ref Range Status  SARS Coronavirus 2 by RT PCR NEGATIVE NEGATIVE Final    Comment: (NOTE) SARS-CoV-2 target nucleic acids are NOT DETECTED.  The SARS-CoV-2 RNA is generally detectable in upper respiratory specimens during the acute phase of infection. The lowest concentration of SARS-CoV-2 viral copies this assay can detect is 138 copies/mL. A negative result does not preclude SARS-Cov-2 infection and should not be used as the sole basis for treatment or other patient management decisions. A negative result may occur with  improper specimen collection/handling, submission of specimen other than nasopharyngeal swab, presence of viral mutation(s) within the areas targeted by this assay, and inadequate number of viral copies(<138 copies/mL). A negative result must be combined with clinical observations, patient history, and epidemiological information. The expected result is Negative.  Fact Sheet for Patients:  BloggerCourse.com  Fact Sheet for Healthcare  Providers:  SeriousBroker.it  This test is no t yet approved or cleared by the [place]  FDA and  has been authorized for detection and/or diagnosis of SARS-CoV-2 by FDA under an Emergency Use Authorization (EUA). This EUA will remain  in effect (meaning this test can be used) for the duration of the COVID-19 declaration under Section 564(b)(1) of the Act, 21 U.S.C.section 360bbb-3(b)(1), unless the authorization is terminated  or revoked sooner.       Influenza A by PCR NEGATIVE NEGATIVE Final   Influenza B by PCR NEGATIVE NEGATIVE Final    Comment: (NOTE) The Xpert Xpress SARS-CoV-2/FLU/RSV plus assay is intended as an aid in the diagnosis of influenza from Nasopharyngeal swab specimens and should not be used as a sole basis for treatment. Nasal washings and aspirates are unacceptable for Xpert Xpress SARS-CoV-2/FLU/RSV testing.  Fact Sheet for Patients: BloggerCourse.com  Fact Sheet for Healthcare Providers: SeriousBroker.it  This test is not yet approved or cleared by the [place]  FDA and has been authorized for detection and/or diagnosis of SARS-CoV-2 by FDA under an Emergency Use Authorization (EUA). This EUA will remain in effect (meaning this test can be used) for the duration of the COVID-19 declaration under Section 564(b)(1) of the Act, 21 U.S.C. section 360bbb-3(b)(1), unless the authorization is terminated or revoked.     Resp Syncytial Virus by PCR NEGATIVE NEGATIVE Final    Comment: (NOTE) Fact Sheet for Patients: BloggerCourse.com  Fact Sheet for Healthcare Providers: SeriousBroker.it  This test is not yet approved or cleared by the [place]  FDA and has been authorized for detection and/or diagnosis of SARS-CoV-2 by FDA under an Emergency Use Authorization (EUA). This EUA will remain in effect (meaning this test can be  used) for the duration of the COVID-19 declaration under Section 564(b)(1) of the Act, 21 U.S.C. section 360bbb-3(b)(1), unless the authorization is terminated or revoked.  Performed at [facility], [address] [place], [place] [address]     Labs: CBC: Recent Labs  Lab 12/17/23 0033 12/17/23 0516  WBC 10.5 9.2  NEUTROABS 6.0  --   HGB 15.0 14.4  HCT 44.2 43.0  MCV 91.1 91.9  PLT 243 232   Basic Metabolic Panel: Recent Labs  Lab 12/17/23 0033 12/17/23 0516  NA 141 140  K 3.8 3.7  CL 105 105  CO2 26 24  GLUCOSE 122* 182*  BUN 9 8  CREATININE 0.80 0.72  CALCIUM 9.3 9.1   Liver Function Tests: No results for input(s): AST, ALT, ALKPHOS, BILITOT, PROT, ALBUMIN in the last 168 hours. CBG: No results for input(s): GLUCAP in the last 168 hours.  Discharge time spent: greater  than 30 minutes.  Signed: Murvin Mana, MD [facility] 12/18/2023

## 2023-12-18 NOTE — TOC Transition Note (Addendum)
 Transition of Care [facility]) - Discharge Note   Patient Details  Name: Gabrielle Ray MRN: [id number] Date of Birth: 1965/09/09  [facility] of [facility]) CM/SW Contact:  Nai Dasch C Theodosia Bahena, RN Phone Number: 12/18/2023, 9:58 AM   Clinical Narrative:    Per MD patient is requesting nebulizer  Left message for Mitch at [employer] requesting nebulizer.  11:06 Per Mitch at [employer] nebulizer is processing.   11:30 Spoke with patient at bedside she was grateful for care received. Patient nebulizer at bedside.   TOC signing off.            Patient Goals and CMS Choice            Discharge Placement                       Discharge Plan and Services Additional resources added to the After Visit Summary for                                       Social Drivers of Health (SDOH) Interventions SDOH Screenings   Food Insecurity: Patient Declined (12/17/2023)  Housing: Low Risk  (12/17/2023)  Transportation Needs: No Transportation Needs (12/17/2023)  Utilities: Patient Declined (12/17/2023)  Financial Resource Strain: Medium Risk (07/21/2022)   Received from [facility]  Physical Activity: Insufficiently Active (01/08/2019)   Received from [facility]  Tobacco Use: High Risk (06/21/2023)   Received from [facility]     Readmission Risk Interventions     No data to display

## 2023-12-18 NOTE — Plan of Care (Signed)

## 2023-12-24 ENCOUNTER — Emergency Department: Admission: EM | Admit: 2023-12-24 | Discharge: 2023-12-24 | Disposition: A | Discharge status: Home / Self Care

## 2023-12-24 ENCOUNTER — Other Ambulatory Visit: Payer: Self-pay

## 2023-12-24 DIAGNOSIS — R072 Precordial pain: Secondary | ICD-10-CM | POA: Diagnosis present

## 2023-12-24 DIAGNOSIS — R110 Nausea: Secondary | ICD-10-CM | POA: Diagnosis not present

## 2023-12-24 DIAGNOSIS — I10 Essential (primary) hypertension: Secondary | ICD-10-CM | POA: Insufficient documentation

## 2023-12-24 DIAGNOSIS — R079 Chest pain, unspecified: Secondary | ICD-10-CM

## 2023-12-24 DIAGNOSIS — J449 Chronic obstructive pulmonary disease, unspecified: Secondary | ICD-10-CM | POA: Insufficient documentation

## 2023-12-24 HISTORY — DX: Chronic obstructive pulmonary disease, unspecified: J44.9

## 2023-12-24 LAB — COMPREHENSIVE METABOLIC PANEL WITH GFR
ALT: 23 U/L (ref 0–44)
AST: 17 U/L (ref 15–41)
Albumin: 3.6 g/dL (ref 3.5–5.0)
Alkaline Phosphatase: 69 U/L (ref 38–126)
Anion gap: 9 (ref 5–15)
BUN: 14 mg/dL (ref 6–20)
CO2: 27 mmol/L (ref 22–32)
Calcium: 8.5 mg/dL — ABNORMAL LOW (ref 8.9–10.3)
Chloride: 102 mmol/L (ref 98–111)
Creatinine, Ser: 0.92 mg/dL (ref 0.44–1.00)
GFR, Estimated: >60 mL/min (ref >60)
Glucose, Bld: 204 mg/dL — ABNORMAL HIGH (ref 70–99)
Potassium: 3.6 mmol/L (ref 3.5–5.1)
Sodium: 138 mmol/L (ref 135–145)
Total Bilirubin: 0.5 mg/dL (ref 0.0–1.2)
Total Protein: 6.2 g/dL — ABNORMAL LOW (ref 6.5–8.1)

## 2023-12-24 LAB — CBC
HCT: 44.7 % (ref 36.0–46.0)
Hemoglobin: 14.9 g/dL (ref 12.0–15.0)
MCH: 30.8 pg (ref 26.0–34.0)
MCHC: 33.3 g/dL (ref 30.0–36.0)
MCV: 92.4 fL (ref 80.0–100.0)
Platelets: 286 K/uL (ref 150–400)
RBC: 4.84 MIL/uL (ref 3.87–5.11)
RDW: 13.9 % (ref 11.5–15.5)
WBC: 14.7 K/uL — ABNORMAL HIGH (ref 4.0–10.5)
nRBC: 0 % (ref 0.0–0.2)

## 2023-12-24 LAB — LIPASE, BLOOD: Lipase: 38 U/L (ref 11–51)

## 2023-12-24 LAB — TROPONIN I (HIGH SENSITIVITY)
Troponin I (High Sensitivity): 3 ng/L (ref <18)
Troponin I (High Sensitivity): 4 ng/L (ref <18)

## 2023-12-24 MED ORDER — SUCRALFATE 1 G PO TABS: 1.0000 g | ORAL_TABLET | Freq: Three times a day (TID) | ORAL | 1 refills | Status: AC | PRN

## 2023-12-24 MED ORDER — SUCRALFATE 1 G PO TABS
1.0000 g | ORAL_TABLET | Freq: Once | ORAL | Status: AC
  Administered 2023-12-24: 1 g via ORAL
  Filled 2023-12-24: qty 1

## 2023-12-24 NOTE — ED Triage Notes (Signed)
 Arrived by [facility] from home. C/o abdominal and chest pain that started today. Reports pain in epigastric region and radiates into back.  Ambulatory with EMS  EMS administered: 324 aspirin  118/76 b/p 68HR 16RR 96% RA 109 CBG

## 2023-12-24 NOTE — ED Triage Notes (Signed)
 Pt to ED for bilateral upper abdominal pain and substernal chest pain that radiates to R neck and shoulders since earlier today. Unsure if related to eating. Has GB.

## 2023-12-24 NOTE — Discharge Instructions (Addendum)
 You were seen in the emergency department for an episode of chest pain.  Workup today was reassuring but that does not mean that nothing is wrong but whether you are safe to continue the workup as an outpatient.  Please continue your regular medication.  Please follow-up with your primary care physician.  An outpatient referral to cardiology has been placed on your behalf you should receive a call to help schedule this.  Please return with any acutely worsening symptoms.  Is very nice taking care of you and I wish you the best of luck with everything. Rolland Moats, MD, PhD -- RETURN PRECAUTIONS & AFTERCARE: (ENGLISH) RETURN PRECAUTIONS: Return immediately to the emergency department or see/call your doctor if you feel worse, weak or have changes in speech or vision, are short of breath, have fever, vomiting, pain, bleeding or dark stool, trouble urinating or any new issues. Return here or see/call your doctor if not improving as expected for your suspected condition. FOLLOW-UP CARE: Call your doctor and/or any doctors we referred you to for more advice and to make an appointment. Do this today, tomorrow or after the weekend. Some doctors only take PPO insurance so if you have HMO insurance you may want to contact your HMO or your regular doctor for referral to a specialist within your plan. Either way tell the doctor's office that it was a referral from the emergency department so you get the soonest possible appointment.  YOUR TEST RESULTS: Take result reports of any blood or urine tests, imaging tests and EKG's to your doctor and any referral doctor. Have any abnormal tests repeated. Your doctor or a referral doctor can let you know when this should be done. Also make sure your doctor contacts this hospital to get any test results that are not currently available such as cultures or special tests for infection and final imaging reports, which are often not available at the time you leave the ER but which may  list additional important findings that are not documented on the preliminary report. BLOOD PRESSURE: If your blood pressure was greater than 120/80 have your blood pressure rechecked within 1 to 2 weeks. MEDICATION SIDE EFFECTS: Do not drive, walk, bike, take the bus, etc. if you have received or are being prescribed any sedating medications such as those for pain or anxiety or certain antihistamines like Benadryl. If you have been give one of these here get a taxi home or have a friend drive you home. Ask your pharmacist to counsel you on potential side effects of any new medication

## 2023-12-24 NOTE — ED Provider Notes (Signed)
 [facility] Provider Note    Event Date/Time   First MD Initiated Contact with Patient 12/24/23 1717     (approximate)   History   Abdominal Pain and Chest Pain   HPI  Gabrielle Ray is a 58 y.o. female  with medical history significant for fibromyalgia and hypertension and COPD with recent hospitalization for COPD who presents with an episode of chest pain and nausea.  Patient states that she was in her normal state of health and consumed a full meal at [employer] earlier today.  45 minutes later she developed epigastric and midsternal chest pain that radiated up her neck.  There was associated nausea but no shortness of breath.  She became concerned that she might be having a heart attack and presented to the emergency department.  At the time of initial presentation her pain was reported as 7 currently now asymptomatic  She denies any abdominal pain currently.  She presents with her daughter who contributes to the history.  She has no prior history of CAD      Physical Exam   Triage Vital Signs: ED Triage Vitals  Encounter Vitals Group     BP 12/24/23 1342 103/67     Girls Systolic BP Percentile --      Girls Diastolic BP Percentile --      Boys Systolic BP Percentile --      Boys Diastolic BP Percentile --      Pulse Rate 12/24/23 1342 69     Resp 12/24/23 1342 16     Temp 12/24/23 1342 (!) 97.5 F (36.4 C)     Temp Source 12/24/23 1342 Oral     SpO2 12/24/23 1342 96 %     Weight 12/24/23 1341 238 lb 1.6 oz (108 kg)     Height 12/24/23 1341 5' 6 (1.676 m)     Head Circumference --      Peak Flow --      Pain Score 12/24/23 1337 3     Pain Loc --      Pain Education --      Exclude from Growth Chart --     Most recent vital signs: Vitals:   12/24/23 1715 12/24/23 1730  BP: 124/75 126/75  Pulse: 62 64  Resp: 11 (!) 22  Temp:    SpO2: 97% 96%    Nursing Triage Note reviewed. Vital signs reviewed and patients oxygen  saturation is normoxic  General: Patient is well nourished, well developed, awake and alert, resting comfortably in no acute distress Head: Normocephalic and atraumatic Eyes: Normal inspection, extraocular muscles intact, no conjunctival pallor Ear, nose, throat: Normal external exam Neck: Normal range of motion Respiratory: Patient is in no respiratory distress, lungs CTAB Cardiovascular: Patient is not tachycardic, RRR without murmur appreciated GI: Abd SNT with no guarding or rebound  Back: Normal inspection of the back with good strength and range of motion throughout all ext Extremities: pulses intact with good cap refills, no LE pitting edema or calf tenderness Neuro: The patient is alert and oriented to person, place, and time, appropriately conversive, with 5/5 bilat UE/LE strength, no gross motor or sensory defects noted. Coordination appears to be adequate. Skin: Warm, dry, and intact Psych: normal mood and affect, no SI or HI  ED Results / Procedures / Treatments   Labs (all labs ordered are listed, but only abnormal results are displayed) Labs Reviewed  COMPREHENSIVE METABOLIC PANEL WITH GFR - Abnormal; Notable  for the following components:      Result Value   Glucose, Bld 204 (*)    Calcium 8.5 (*)    Total Protein 6.2 (*)    All other components within normal limits  CBC - Abnormal; Notable for the following components:   WBC 14.7 (*)    All other components within normal limits  LIPASE, BLOOD  TROPONIN I (HIGH SENSITIVITY)  TROPONIN I (HIGH SENSITIVITY)     EKG EKG and rhythm strip are interpreted by myself:   EKG: [Normal sinus rhythm] at heart rate of 73, normal QRS duration, QTc 436, normal ST segments and T waves no ectopy EKG not consistent with Acute STEMI Rhythm strip: NSRin lead II  Xray chest: none.   PROCEDURES:  Critical Care performed: No  Procedures   MEDICATIONS ORDERED IN ED: Medications  sucralfate  (CARAFATE ) tablet 1 g (1 g Oral  Given 12/24/23 1734)     IMPRESSION / MDM / ASSESSMENT AND PLAN / ED COURSE           HEART Score: 2                     Differential diagnosis includes, but is not limited to, ACS, GERD, anemia, arrhythmia  ED course: Patient is well-appearing and asymptomatic.  Denies any chest pain or shortness of breath currently.  I did consider obtaining a chest x-ray but decided against it given the history and recent chest x-ray completed on 12/17/2023 which was normal.  Troponin x 2 was not elevated.  Patient's heart score a 2.  She feels comfortable returning home at this time  Clinical Course as of 12/24/23 1845  Mon Dec 24, 2023  1716 WBC(!): 14.7 Leukocytosis patient does have a temperature of 97.5 [HD]  1716 Comprehensive metabolic panel(!) Besides: Mildly elevated glucose no acute abnormalities [HD]  1717 Troponin I (High Sensitivity): 3 Not elevated [HD]  1726 Case discuss [HD]  1838 Patient feels much improved since denies any chest pain.  Daughter at bedside.  Reviewed discharge precautions and she will follow-up with primary care physician and I will place an outpatient referral to cardiology.  Will send a prescription for sucralfate  to the patient's pharmacy of choice.  All questions answered and patient voiced understanding and requested discharge [HD]    Clinical Course User Index [HD] Nicholaus Rolland BRAVO, MD   At time of discharge there is no evidence of acute life, limb, vision, or fertility threat. Patient has stable vital signs, pain is well controlled, patient is ambulatory and p.o. tolerant.  Discharge instructions were completed using the [employer] system. I would refer you to those at this time. All warnings prescriptions follow-up etc. were discussed in detail with the patient. Patient indicates understanding and is agreeable with this plan. All questions answered.  Patient is made aware that they may return to the emergency department for any worsening or new condition or for  any other emergency.  Risk: 5 This patient has a high risk of morbidity due to further diagnostic testing or treatment. Rationale: This patient's evaluation and management involve a high risk of morbidity due to the potential severity of presenting symptoms, need for diagnostic testing, and/or initiation of treatment that may require close monitoring. The differential includes conditions with potential for significant deterioration or requiring escalation of care. Treatment decisions in the ED, including medication administration, procedural interventions, or disposition planning, reflect this level of risk. Additional Support: -- Drug therapy requiring intensive monitoring for toxicity [ ]  --  Decision regarding elective major surgery with idenitified patient or procedure risk factors [ ]  -- Decision regarding hospitalization or escalation of hospital-level care [ ]  -- Decision not to resuscitate or to de-escalate care because of poor prognosis [ ]  -- Parental controlled substances [ ]   COPA: 5 The patient has a severe exacerbation, progression, or side effect of treatment of the following illness/illnesses: []  OR  The patient has the following acute or chronic illness/injury that poses a possible threat to life or bodily function: [X] : The patient has a potentially serious acute condition or an acute exacerbation of a chronic illness requiring urgent evaluation and management in the Emergency Department. The clinical presentation necessitates immediate consideration of life-threatening or function-threatening diagnoses, even if they are ultimately ruled out.  Data(2/3 categories following were performed): 4 I reviewed or ordered at least three unique tests, external notes, and/or the history required an independent historian as one of the three requirements as following: CBC, CMP, troponin x 2, prior hospital discharge, patient's daughter at bedside AND  I independently interpreted the  following test: X-ray completed on 12/17/2023 which was unremarkable on my independent review and interpretation OR  I discussed the management of the patient with the following external physician or qualified healthcare provider: []     Suggested E/M Coding Level: 5, 99285, This has been selected based on the 2022-01-03 CPT guidelines for E/M codes in the Emergency Department based on 2/3 of the CoPA, Data, and Risk.   FINAL CLINICAL IMPRESSION(S) / ED DIAGNOSES   Final diagnoses:  Chest pain, unspecified type     Rx / DC Orders   ED Discharge Orders          Ordered    Ambulatory referral to Cardiology       Comments: If you have not heard from the Cardiology office within the next 72 hours please call [phone].   12/24/23 1839    sucralfate  (CARAFATE ) 1 g tablet  3 times daily PRN        12/24/23 1839             Note:  This document was prepared using Dragon voice recognition software and may include unintentional dictation errors.   Nicholaus Rolland BRAVO, MD 12/24/23 [phone]
